# Patient Record
Sex: Female | Born: 1955
Health system: Southern US, Community
[De-identification: ages and names within clinical notes are randomized; demographics above are authoritative.]

## PROBLEM LIST (undated history)

## (undated) DIAGNOSIS — J309 Allergic rhinitis, unspecified: Secondary | ICD-10-CM

## (undated) DIAGNOSIS — N393 Stress incontinence (female) (male): Secondary | ICD-10-CM

## (undated) DIAGNOSIS — K219 Gastro-esophageal reflux disease without esophagitis: Secondary | ICD-10-CM

## (undated) DIAGNOSIS — D219 Benign neoplasm of connective and other soft tissue, unspecified: Secondary | ICD-10-CM

## (undated) DIAGNOSIS — R569 Unspecified convulsions: Secondary | ICD-10-CM

## (undated) DIAGNOSIS — G5600 Carpal tunnel syndrome, unspecified upper limb: Secondary | ICD-10-CM

## (undated) DIAGNOSIS — J45909 Unspecified asthma, uncomplicated: Secondary | ICD-10-CM

## (undated) HISTORY — PX: TUBAL LIGATION: SHX77

---

## 2004-05-06 ENCOUNTER — Other Ambulatory Visit: Payer: Self-pay

## 2004-05-06 ENCOUNTER — Emergency Department: Payer: Self-pay | Admitting: Emergency Medicine

## 2006-02-14 ENCOUNTER — Ambulatory Visit: Payer: Self-pay

## 2007-05-07 ENCOUNTER — Ambulatory Visit: Payer: Self-pay

## 2007-11-15 ENCOUNTER — Ambulatory Visit: Payer: Self-pay

## 2008-09-15 ENCOUNTER — Ambulatory Visit: Payer: Self-pay

## 2009-04-08 ENCOUNTER — Emergency Department: Payer: Self-pay | Admitting: Emergency Medicine

## 2010-04-27 ENCOUNTER — Ambulatory Visit: Payer: Self-pay | Admitting: Nurse Practitioner

## 2012-07-31 ENCOUNTER — Ambulatory Visit: Payer: Self-pay

## 2015-01-04 ENCOUNTER — Other Ambulatory Visit: Payer: Self-pay | Admitting: Obstetrics and Gynecology

## 2015-01-04 DIAGNOSIS — Z87440 Personal history of urinary (tract) infections: Secondary | ICD-10-CM

## 2015-01-05 ENCOUNTER — Other Ambulatory Visit: Payer: Self-pay | Admitting: Obstetrics and Gynecology

## 2015-01-05 DIAGNOSIS — Z1231 Encounter for screening mammogram for malignant neoplasm of breast: Secondary | ICD-10-CM

## 2015-01-08 ENCOUNTER — Ambulatory Visit
Admission: RE | Admit: 2015-01-08 | Discharge: 2015-01-08 | Disposition: A | Discharge status: Home / Self Care | Payer: PRIVATE HEALTH INSURANCE | Source: Ambulatory Visit | Attending: Obstetrics and Gynecology | Admitting: Obstetrics and Gynecology

## 2015-01-08 DIAGNOSIS — Z87440 Personal history of urinary (tract) infections: Secondary | ICD-10-CM | POA: Diagnosis present

## 2015-01-08 DIAGNOSIS — K760 Fatty (change of) liver, not elsewhere classified: Secondary | ICD-10-CM | POA: Diagnosis not present

## 2015-01-08 MED ORDER — IOHEXOL 300 MG/ML  SOLN
125.0000 mL | Freq: Once | INTRAMUSCULAR | Status: AC | PRN
  Administered 2015-01-08: 125 mL via INTRAVENOUS

## 2015-01-12 ENCOUNTER — Ambulatory Visit
Admission: RE | Admit: 2015-01-12 | Discharge: 2015-01-12 | Disposition: A | Discharge status: Home / Self Care | Payer: PRIVATE HEALTH INSURANCE | Source: Ambulatory Visit | Attending: Obstetrics and Gynecology | Admitting: Obstetrics and Gynecology

## 2015-01-12 DIAGNOSIS — Z1231 Encounter for screening mammogram for malignant neoplasm of breast: Secondary | ICD-10-CM | POA: Insufficient documentation

## 2015-02-09 ENCOUNTER — Ambulatory Visit: Payer: Self-pay | Admitting: Urology

## 2015-02-09 ENCOUNTER — Encounter: Payer: Self-pay | Admitting: Urology

## 2015-06-20 HISTORY — PX: OTHER SURGICAL HISTORY: SHX169

## 2015-12-03 ENCOUNTER — Other Ambulatory Visit: Payer: Self-pay | Admitting: Obstetrics and Gynecology

## 2015-12-03 DIAGNOSIS — Z1231 Encounter for screening mammogram for malignant neoplasm of breast: Secondary | ICD-10-CM

## 2016-01-10 ENCOUNTER — Ambulatory Visit
Admission: RE | Admit: 2016-01-10 | Discharge: 2016-01-10 | Disposition: A | Discharge status: Home / Self Care | Payer: BLUE CROSS/BLUE SHIELD | Source: Ambulatory Visit | Attending: Obstetrics and Gynecology | Admitting: Obstetrics and Gynecology

## 2016-01-10 DIAGNOSIS — Z1231 Encounter for screening mammogram for malignant neoplasm of breast: Secondary | ICD-10-CM | POA: Diagnosis not present

## 2016-02-10 ENCOUNTER — Encounter: Payer: Self-pay | Admitting: *Deleted

## 2016-02-11 ENCOUNTER — Ambulatory Visit
Admission: RE | Admit: 2016-02-11 | Discharge: 2016-02-11 | Disposition: A | Discharge status: Home / Self Care | Payer: BLUE CROSS/BLUE SHIELD | Source: Ambulatory Visit | Attending: Gastroenterology | Admitting: Gastroenterology

## 2016-02-11 ENCOUNTER — Ambulatory Visit: Payer: BLUE CROSS/BLUE SHIELD | Admitting: Anesthesiology

## 2016-02-11 ENCOUNTER — Encounter
Admission: RE | Disposition: A | Discharge status: Home / Self Care | Payer: Self-pay | Source: Ambulatory Visit | Attending: Gastroenterology

## 2016-02-11 ENCOUNTER — Encounter: Payer: Self-pay | Admitting: Anesthesiology

## 2016-02-11 DIAGNOSIS — G5600 Carpal tunnel syndrome, unspecified upper limb: Secondary | ICD-10-CM | POA: Diagnosis not present

## 2016-02-11 DIAGNOSIS — K5730 Diverticulosis of large intestine without perforation or abscess without bleeding: Secondary | ICD-10-CM | POA: Diagnosis not present

## 2016-02-11 DIAGNOSIS — N393 Stress incontinence (female) (male): Secondary | ICD-10-CM | POA: Insufficient documentation

## 2016-02-11 DIAGNOSIS — J45909 Unspecified asthma, uncomplicated: Secondary | ICD-10-CM | POA: Diagnosis not present

## 2016-02-11 DIAGNOSIS — K219 Gastro-esophageal reflux disease without esophagitis: Secondary | ICD-10-CM | POA: Diagnosis not present

## 2016-02-11 DIAGNOSIS — Z1211 Encounter for screening for malignant neoplasm of colon: Secondary | ICD-10-CM | POA: Diagnosis not present

## 2016-02-11 HISTORY — PX: COLONOSCOPY WITH PROPOFOL: SHX5780

## 2016-02-11 HISTORY — DX: Gastro-esophageal reflux disease without esophagitis: K21.9

## 2016-02-11 HISTORY — DX: Benign neoplasm of connective and other soft tissue, unspecified: D21.9

## 2016-02-11 HISTORY — DX: Unspecified asthma, uncomplicated: J45.909

## 2016-02-11 HISTORY — DX: Allergic rhinitis, unspecified: J30.9

## 2016-02-11 HISTORY — DX: Carpal tunnel syndrome, unspecified upper limb: G56.00

## 2016-02-11 HISTORY — DX: Unspecified convulsions: R56.9

## 2016-02-11 HISTORY — DX: Stress incontinence (female) (male): N39.3

## 2016-02-11 SURGERY — COLONOSCOPY WITH PROPOFOL
Anesthesia: General

## 2016-02-11 MED ORDER — FENTANYL CITRATE (PF) 100 MCG/2ML IJ SOLN
INTRAMUSCULAR | Status: DC | PRN
  Administered 2016-02-11: 50 ug via INTRAVENOUS

## 2016-02-11 MED ORDER — PROPOFOL 10 MG/ML IV BOLUS
INTRAVENOUS | Status: DC | PRN
  Administered 2016-02-11: 40 mg via INTRAVENOUS

## 2016-02-11 MED ORDER — PROPOFOL 500 MG/50ML IV EMUL
INTRAVENOUS | Status: DC | PRN
  Administered 2016-02-11: 140 ug/kg/min via INTRAVENOUS

## 2016-02-11 MED ORDER — SODIUM CHLORIDE 0.9 % IV SOLN
INTRAVENOUS | Status: DC
  Administered 2016-02-11: 1000 mL via INTRAVENOUS
  Administered 2016-02-11: 10:00:00 via INTRAVENOUS

## 2016-02-11 MED ORDER — SODIUM CHLORIDE 0.9 % IV SOLN: INTRAVENOUS | Status: DC

## 2016-02-11 MED ORDER — MIDAZOLAM HCL 2 MG/2ML IJ SOLN
INTRAMUSCULAR | Status: DC | PRN
  Administered 2016-02-11: 1 mg via INTRAVENOUS

## 2016-02-11 NOTE — Transfer of Care (Signed)
Immediate Anesthesia Transfer of Care Note  Patient: Jamie Fisher  Procedure(s) Performed: Procedure(s): COLONOSCOPY WITH PROPOFOL (N/A)  Patient Location: PACU  Anesthesia Type:General  Level of Consciousness: sedated  Airway & Oxygen Therapy: Patient Spontanous Breathing and Patient connected to nasal cannula oxygen  Post-op Assessment: Report given to RN and Post -op Vital signs reviewed and stable  Post vital signs: Reviewed and stable  Last Vitals:  Vitals:   02/11/16 0816  BP: (!) 154/93  Pulse: 72  Resp: 16  Temp: 37.1 C    Last Pain:  Vitals:   02/11/16 0816  TempSrc: Tympanic         Complications: No apparent anesthesia complications

## 2016-02-11 NOTE — Anesthesia Postprocedure Evaluation (Signed)
Anesthesia Post Note  Patient: Jamie Fisher  Procedure(s) Performed: Procedure(s) (LRB): COLONOSCOPY WITH PROPOFOL (N/A)  Patient location during evaluation: Endoscopy Anesthesia Type: General Level of consciousness: awake and alert Pain management: pain level controlled Vital Signs Assessment: post-procedure vital signs reviewed and stable Respiratory status: spontaneous breathing, nonlabored ventilation, respiratory function stable and patient connected to nasal cannula oxygen Cardiovascular status: blood pressure returned to baseline and stable Postop Assessment: no signs of nausea or vomiting Anesthetic complications: no    Last Vitals:  Vitals:   02/11/16 1034 02/11/16 1044  BP: 114/62 (!) 105/59  Pulse: (!) 58 (!) 56  Resp: 10 13  Temp:      Last Pain:  Vitals:   02/11/16 1004  TempSrc: Tympanic                 [name]

## 2016-02-11 NOTE — Op Note (Signed)
[facility] Gastroenterology Patient Name: Jamie Fisher Procedure Date: 02/11/2016 9:29 AM MRN: [id number] Account #: [id number] Date of Birth: 02-22-1956 Admit Type: Outpatient Age: 60 Room: [facility] ENDO ROOM 1 Gender: Female Note Status: Finalized Procedure:            Colonoscopy Indications:          Screening for colorectal malignant neoplasm Providers:            Lollie Sails, MD Referring MD:         Joylene Igo, MD Medicines:            Monitored Anesthesia Care Complications:        No immediate complications. Procedure:            Pre-Anesthesia Assessment:                       - ASA Grade Assessment: II - A patient with mild                        systemic disease.                       After obtaining informed consent, the colonoscope was                        passed under direct vision. Throughout the procedure,                        the patient's blood pressure, pulse, and oxygen                        saturations were monitored continuously. The                        Colonoscope was introduced through the anus and                        advanced to the the cecum, identified by the ileocecal                        valve. The colonoscopy was unusually difficult due to                        poor bowel prep with stool present. Successful                        completion of the procedure was aided by lavage. The                        patient tolerated the procedure well. The quality of                        the bowel preparation was good except the ascending                        colon and cecum was poor. Findings:      Multiple medium-mouthed diverticula were found in the sigmoid colon and       descending colon.      A large amount of solid stool was found in the ascending colon and in  the cecum, precluding visualization.      The retroflexed view of the distal rectum and anal verge was normal and       showed no anal or rectal  abnormalities.      The digital rectal exam was normal.      The digital rectal exam was normal. Impression:           - Diverticulosis in the sigmoid colon and in the                        descending colon.                       - Stool in the ascending colon and in the cecum.                       - The distal rectum and anal verge are normal on                        retroflexion view.                       - No specimens collected. Recommendation:       - Discharge patient to home.                       - Repeat colonoscopy in 2 years for screening purposes.                       - Return to GI office at appointment to be scheduled. Procedure Code(s):    --- Professional ---                       [phone], Colonoscopy, flexible; diagnostic, including                        collection of specimen(s) by brushing or washing, when                        performed (separate procedure) Diagnosis Code(s):    --- Professional ---                       Z12.11, Encounter for screening for malignant neoplasm                        of colon                       K57.30, Diverticulosis of large intestine without                        perforation or abscess without bleeding CPT copyright 2016 American [employer] Association. All rights reserved. The codes documented in this report are preliminary and upon coder review may  be revised to meet current compliance requirements. Lollie Sails, MD 02/11/2016 10:05:21 AM This report has been signed electronically. Number of Addenda: 0 Note Initiated On: 02/11/2016 9:29 AM Scope Withdrawal Time: 0 hours 3 minutes 7 seconds  Total Procedure Duration: 0 hours 19 minutes 27 seconds       [facility]

## 2016-02-11 NOTE — H&P (Signed)
Outpatient short stay form Pre-procedure 02/11/2016 9:14 AM Lollie Sails MD  Primary Physician: Joylene Igo M.D.  Reason for visit:  Patient is a 60 year old female presenting today for screening colonoscopy. She tolerated her prep well. She did eat some solid foods yesterday before she started prep. She has been checked this morning and rectal exam shows no debris and a bowel movement was watery clear. She takes no aspirin or blood thinning agents.    History of present illness:  Colonoscopy as above    Current Facility-Administered Medications:  .  0.9 %  sodium chloride infusion, , Intravenous, Continuous, Lollie Sails, MD, Last Rate: 20 mL/hr at 02/11/16 0838, 1,000 mL at 02/11/16 0838 .  0.9 %  sodium chloride infusion, , Intravenous, Continuous, Lollie Sails, MD  No prescriptions prior to admission.     No Known Allergies   Past Medical History:  Diagnosis Date  . Allergic rhinitis   . Asthma   . Carpal tunnel syndrome   . Fibroid   . GERD (gastroesophageal reflux disease)   . Seizures (HCC)    AS A CHILD  . Stress incontinence in female     Review of systems:      Physical Exam    Heart and lungs: Regular rate and rhythm without rub or gallop, lungs are bilaterally clear.    HEENT: Normocephalic atraumatic eyes are anicteric    Other:     Pertinant exam for procedure: Soft nontender nondistended bowel sounds positive normoactive.    Planned proceedures: Colonoscopy and indicated procedures. I have discussed the risks benefits and complications of procedures to include not limited to bleeding, infection, perforation and the risk of sedation and the patient wishes to proceed.   Lollie Sails, MD Gastroenterology 02/11/2016  9:14 AM

## 2016-02-11 NOTE — Anesthesia Preprocedure Evaluation (Signed)
Anesthesia Evaluation  Patient identified by MRN, date of birth, ID band Patient awake    Reviewed: Allergy & Precautions, H&P , NPO status , Patient's Chart, lab work & pertinent test results, reviewed documented beta blocker date and time   History of Anesthesia Complications Negative for: history of anesthetic complications  Airway Mallampati: II  TM Distance: >3 FB Neck ROM: full    Dental no notable dental hx. (+) Caps, Teeth Intact   Pulmonary neg shortness of breath, asthma , neg sleep apnea, neg COPD, neg recent URI,    Pulmonary exam normal breath sounds clear to auscultation       Cardiovascular Exercise Tolerance: Good negative cardio ROS Normal cardiovascular exam Rhythm:regular Rate:Normal     Neuro/Psych Seizures - (as a child),   Neuromuscular disease ([place]) negative psych ROS   GI/Hepatic Neg liver ROS, GERD  ,  Endo/Other  negative endocrine ROS  Renal/GU negative Renal ROS  negative genitourinary   Musculoskeletal   Abdominal   Peds  Hematology negative hematology ROS (+)   Anesthesia Other Findings Past Medical History: No date: Allergic rhinitis No date: Asthma No date: Carpal tunnel syndrome No date: Fibroid No date: GERD (gastroesophageal reflux disease) No date: Seizures (HCC)     Comment: AS A CHILD No date: Stress incontinence in female   Reproductive/Obstetrics negative OB ROS                             Anesthesia Physical Anesthesia Plan  ASA: II  Anesthesia Plan: General   Post-op Pain Management:    Induction:   Airway Management Planned:   Additional Equipment:   Intra-op Plan:   Post-operative Plan:   Informed Consent: I have reviewed the patients History and Physical, chart, labs and discussed the procedure including the risks, benefits and alternatives for the proposed anesthesia with the patient or authorized representative who has  indicated his/her understanding and acceptance.   Dental Advisory Given  Plan Discussed with: Anesthesiologist, CRNA and Surgeon  Anesthesia Plan Comments:         Anesthesia Quick Evaluation

## 2016-02-11 NOTE — Anesthesia Procedure Notes (Signed)
Date/Time: 02/11/2016 9:45 AM Performed by: Allean Found Pre-anesthesia Checklist: Patient identified, Emergency Drugs available, Suction available, Patient being monitored and Timeout performed Patient Re-evaluated:Patient Re-evaluated prior to inductionOxygen Delivery Method: Nasal cannula

## 2016-02-14 ENCOUNTER — Encounter: Payer: Self-pay | Admitting: Gastroenterology

## 2016-02-22 ENCOUNTER — Other Ambulatory Visit: Payer: Self-pay | Admitting: Gastroenterology

## 2016-02-22 DIAGNOSIS — R1010 Upper abdominal pain, unspecified: Secondary | ICD-10-CM

## 2016-02-25 ENCOUNTER — Ambulatory Visit
Admission: RE | Admit: 2016-02-25 | Discharge: 2016-02-25 | Disposition: A | Discharge status: Home / Self Care | Payer: BLUE CROSS/BLUE SHIELD | Source: Ambulatory Visit | Attending: Gastroenterology | Admitting: Gastroenterology

## 2016-02-25 DIAGNOSIS — R1010 Upper abdominal pain, unspecified: Secondary | ICD-10-CM | POA: Diagnosis present

## 2016-02-25 DIAGNOSIS — K449 Diaphragmatic hernia without obstruction or gangrene: Secondary | ICD-10-CM | POA: Diagnosis not present

## 2016-02-25 DIAGNOSIS — K219 Gastro-esophageal reflux disease without esophagitis: Secondary | ICD-10-CM | POA: Insufficient documentation

## 2016-03-17 ENCOUNTER — Other Ambulatory Visit: Payer: Self-pay | Admitting: Gastroenterology

## 2016-03-17 DIAGNOSIS — R1084 Generalized abdominal pain: Secondary | ICD-10-CM

## 2016-03-22 ENCOUNTER — Ambulatory Visit
Admission: RE | Admit: 2016-03-22 | Discharge: 2016-03-22 | Disposition: A | Discharge status: Home / Self Care | Payer: BLUE CROSS/BLUE SHIELD | Source: Ambulatory Visit | Attending: Gastroenterology | Admitting: Gastroenterology

## 2016-03-22 DIAGNOSIS — R935 Abnormal findings on diagnostic imaging of other abdominal regions, including retroperitoneum: Secondary | ICD-10-CM | POA: Insufficient documentation

## 2016-03-22 DIAGNOSIS — R1084 Generalized abdominal pain: Secondary | ICD-10-CM

## 2016-03-22 DIAGNOSIS — R1010 Upper abdominal pain, unspecified: Secondary | ICD-10-CM | POA: Diagnosis present

## 2016-05-04 ENCOUNTER — Other Ambulatory Visit: Payer: Self-pay | Admitting: Gastroenterology

## 2016-05-04 DIAGNOSIS — R1013 Epigastric pain: Secondary | ICD-10-CM

## 2016-05-22 ENCOUNTER — Ambulatory Visit: Admission: RE | Admit: 2016-05-22 | Payer: BLUE CROSS/BLUE SHIELD | Source: Ambulatory Visit

## 2016-05-23 ENCOUNTER — Encounter
Admission: RE | Admit: 2016-05-23 | Discharge: 2016-05-23 | Disposition: A | Discharge status: Home / Self Care | Payer: BLUE CROSS/BLUE SHIELD | Source: Ambulatory Visit | Attending: Gastroenterology | Admitting: Gastroenterology

## 2016-05-23 DIAGNOSIS — R1013 Epigastric pain: Secondary | ICD-10-CM | POA: Diagnosis present

## 2016-05-23 MED ORDER — TECHNETIUM TC 99M MEBROFENIN IV KIT
5.3400 | PACK | Freq: Once | INTRAVENOUS | Status: AC | PRN
  Administered 2016-05-23: 5.34 via INTRAVENOUS

## 2016-05-24 ENCOUNTER — Other Ambulatory Visit: Payer: PRIVATE HEALTH INSURANCE

## 2016-08-21 ENCOUNTER — Encounter: Payer: Self-pay | Admitting: *Deleted

## 2016-08-22 ENCOUNTER — Ambulatory Visit: Payer: BLUE CROSS/BLUE SHIELD | Admitting: Anesthesiology

## 2016-08-22 ENCOUNTER — Encounter
Admission: RE | Disposition: A | Discharge status: Home / Self Care | Payer: Self-pay | Source: Ambulatory Visit | Attending: Gastroenterology

## 2016-08-22 ENCOUNTER — Ambulatory Visit
Admission: RE | Admit: 2016-08-22 | Discharge: 2016-08-22 | Disposition: A | Discharge status: Home / Self Care | Payer: BLUE CROSS/BLUE SHIELD | Source: Ambulatory Visit | Attending: Gastroenterology | Admitting: Gastroenterology

## 2016-08-22 ENCOUNTER — Encounter: Payer: Self-pay | Admitting: *Deleted

## 2016-08-22 ENCOUNTER — Other Ambulatory Visit: Payer: Self-pay | Admitting: Gastroenterology

## 2016-08-22 DIAGNOSIS — Z79899 Other long term (current) drug therapy: Secondary | ICD-10-CM | POA: Insufficient documentation

## 2016-08-22 DIAGNOSIS — G5600 Carpal tunnel syndrome, unspecified upper limb: Secondary | ICD-10-CM | POA: Insufficient documentation

## 2016-08-22 DIAGNOSIS — K2950 Unspecified chronic gastritis without bleeding: Secondary | ICD-10-CM | POA: Diagnosis not present

## 2016-08-22 DIAGNOSIS — K210 Gastro-esophageal reflux disease with esophagitis: Secondary | ICD-10-CM | POA: Insufficient documentation

## 2016-08-22 DIAGNOSIS — K2210 Ulcer of esophagus without bleeding: Secondary | ICD-10-CM | POA: Insufficient documentation

## 2016-08-22 DIAGNOSIS — J45909 Unspecified asthma, uncomplicated: Secondary | ICD-10-CM | POA: Diagnosis not present

## 2016-08-22 DIAGNOSIS — R1031 Right lower quadrant pain: Secondary | ICD-10-CM

## 2016-08-22 DIAGNOSIS — N393 Stress incontinence (female) (male): Secondary | ICD-10-CM | POA: Insufficient documentation

## 2016-08-22 DIAGNOSIS — R1013 Epigastric pain: Secondary | ICD-10-CM | POA: Insufficient documentation

## 2016-08-22 DIAGNOSIS — R1011 Right upper quadrant pain: Secondary | ICD-10-CM

## 2016-08-22 DIAGNOSIS — J309 Allergic rhinitis, unspecified: Secondary | ICD-10-CM | POA: Diagnosis not present

## 2016-08-22 DIAGNOSIS — R011 Cardiac murmur, unspecified: Secondary | ICD-10-CM | POA: Insufficient documentation

## 2016-08-22 HISTORY — PX: ESOPHAGOGASTRODUODENOSCOPY: SHX5428

## 2016-08-22 SURGERY — EGD (ESOPHAGOGASTRODUODENOSCOPY)
Anesthesia: General

## 2016-08-22 MED ORDER — PROPOFOL 500 MG/50ML IV EMUL
INTRAVENOUS | Status: DC | PRN
Start: 2016-08-22 — End: 2016-08-22
  Administered 2016-08-22: 50 ug/kg/min via INTRAVENOUS

## 2016-08-22 MED ORDER — PROPOFOL 500 MG/50ML IV EMUL
INTRAVENOUS | Status: AC
  Filled 2016-08-22: qty 50

## 2016-08-22 MED ORDER — EPHEDRINE SULFATE 50 MG/ML IJ SOLN
INTRAMUSCULAR | Status: AC
  Filled 2016-08-22: qty 1

## 2016-08-22 MED ORDER — PHENYLEPHRINE HCL 10 MG/ML IJ SOLN
INTRAMUSCULAR | Status: DC | PRN
  Administered 2016-08-22: 100 ug via INTRAVENOUS

## 2016-08-22 MED ORDER — SODIUM CHLORIDE 0.9 % IV SOLN
INTRAVENOUS | Status: DC
  Administered 2016-08-22: 10:00:00 via INTRAVENOUS

## 2016-08-22 MED ORDER — GLYCOPYRROLATE 0.2 MG/ML IJ SOLN
INTRAMUSCULAR | Status: AC
  Filled 2016-08-22: qty 1

## 2016-08-22 MED ORDER — KETAMINE HCL 10 MG/ML IJ SOLN
INTRAMUSCULAR | Status: AC
  Filled 2016-08-22: qty 1

## 2016-08-22 MED ORDER — KETAMINE HCL 10 MG/ML IJ SOLN
INTRAMUSCULAR | Status: DC | PRN
  Administered 2016-08-22 (×2): 10 mg via INTRAVENOUS

## 2016-08-22 NOTE — Transfer of Care (Signed)
Immediate Anesthesia Transfer of Care Note  Patient: Jamie Fisher  Procedure(s) Performed: Procedure(s): ESOPHAGOGASTRODUODENOSCOPY (EGD) (N/A)  Patient Location: PACU  Anesthesia Type:General  Level of Consciousness: awake, alert  and oriented  Airway & Oxygen Therapy: Patient Spontanous Breathing and Patient connected to nasal cannula oxygen  Post-op Assessment: Report given to RN and Post -op Vital signs reviewed and stable  Post vital signs: Reviewed and stable  Last Vitals:  Vitals:   08/22/16 0932  BP: 137/80  Pulse: (!) 56  Resp: 18  Temp: (!) 35.4 C    Last Pain:  Vitals:   08/22/16 0932  TempSrc: Tympanic         Complications: No apparent anesthesia complications

## 2016-08-22 NOTE — Transfer of Care (Signed)
Immediate Anesthesia Transfer of Care Note  Patient: Jamie Fisher  Procedure(s) Performed: Procedure(s): ESOPHAGOGASTRODUODENOSCOPY (EGD) (N/A)  Patient Location: Endoscopy Unit  Anesthesia Type:General  Level of Consciousness: sedated  Airway & Oxygen Therapy: Patient Spontanous Breathing  Post-op Assessment: Report given to RN and Post -op Vital signs reviewed and stable  Post vital signs: Reviewed and stable  Last Vitals:  Vitals:   08/22/16 0932  BP: 137/80  Pulse: (!) 56  Resp: 18  Temp: (!) 35.4 C    Last Pain:  Vitals:   08/22/16 0932  TempSrc: Tympanic         Complications: No apparent anesthesia complications

## 2016-08-22 NOTE — Op Note (Signed)
[facility] Gastroenterology Patient Name: Jamie Fisher Procedure Date: 08/22/2016 10:06 AM MRN: [id number] Account #: [id number] Date of Birth: 1956/02/03 Admit Type: Outpatient Age: 61 Room: [facility] ENDO ROOM 1 Gender: Female Note Status: Finalized Procedure:            Upper GI endoscopy Indications:          Epigastric abdominal pain, Dyspepsia Providers:            Lollie Sails, MD Referring MD:         No Local Md, MD (Referring MD) Medicines:            Monitored Anesthesia Care Complications:        No immediate complications. Procedure:            Pre-Anesthesia Assessment:                       - ASA Grade Assessment: II - A patient with mild                        systemic disease.                       After obtaining informed consent, the endoscope was                        passed under direct vision. Throughout the procedure,                        the patient's blood pressure, pulse, and oxygen                        saturations were monitored continuously. The Endoscope                        was introduced through the mouth, and advanced to the                        third part of duodenum. The upper GI endoscopy was                        accomplished without difficulty. The patient tolerated                        the procedure well. Findings:      LA Grade B (one or more mucosal breaks greater than 5 mm, not extending       between the tops of two mucosal folds) esophagitis with no bleeding was       found. Biopsies were taken with a cold forceps for histology.      The exam of the esophagus was otherwise normal.      Diffuse mild inflammation characterized by erythema was found in the       gastric body. Biopsies were taken with a cold forceps for histology.       Biopsies were taken with a cold forceps for Helicobacter pylori testing.      Patchy minimal inflammation characterized by erosions and erythema was       found in the gastric  antrum. Biopsies were taken with a cold forceps for       histology. Biopsies were taken with a cold forceps for Helicobacter  pylori testing.      The examined duodenum was normal. Impression:           - LA Grade B erosive esophagitis. Biopsied.                       - Gastritis. Biopsied.                       - Gastritis. Biopsied.                       - Normal examined duodenum. Recommendation:       - Await pathology results.                       - Use Protonix (pantoprazole) 40 mg PO once daily, take                        30 minutes before a meal.                       - Return to GI clinic in 1 month. Procedure Code(s):    --- Professional ---                       [id number], Esophagogastroduodenoscopy, flexible, transoral;                        with biopsy, single or multiple Diagnosis Code(s):    --- Professional ---                       K20.8, Other esophagitis                       K29.70, Gastritis, unspecified, without bleeding                       R10.13, Epigastric pain CPT copyright 2016 American Medical Association. All rights reserved. The codes documented in this report are preliminary and upon coder review may  be revised to meet current compliance requirements. Lollie Sails, MD 08/22/2016 10:30:30 AM This report has been signed electronically. Number of Addenda: 0 Note Initiated On: 08/22/2016 10:06 AM      [facility]

## 2016-08-22 NOTE — Anesthesia Postprocedure Evaluation (Signed)
Anesthesia Post Note  Patient: Jamie Fisher  Procedure(s) Performed: Procedure(s) (LRB): ESOPHAGOGASTRODUODENOSCOPY (EGD) (N/A)  Patient location during evaluation: Endoscopy Anesthesia Type: General Level of consciousness: awake and alert and oriented Pain management: pain level controlled Vital Signs Assessment: post-procedure vital signs reviewed and stable Respiratory status: spontaneous breathing, nonlabored ventilation and respiratory function stable Cardiovascular status: blood pressure returned to baseline and stable Postop Assessment: no signs of nausea or vomiting Anesthetic complications: no     Last Vitals:  Vitals:   08/22/16 0932  BP: 137/80  Pulse: (!) 56  Resp: 18  Temp: (!) 35.4 C    Last Pain:  Vitals:   08/22/16 0932  TempSrc: Tympanic                 [name]

## 2016-08-22 NOTE — Anesthesia Post-op Follow-up Note (Cosign Needed)
Anesthesia QCDR form completed.        

## 2016-08-22 NOTE — H&P (Signed)
Outpatient short stay form Pre-procedure 08/22/2016 9:56 AM Jamie Sails MD  Primary Physician: [facility]  Reason for visit:  EGD  History of present illness:  Jamie Fisher is a 61 year old female presenting today as above. She has a history of epigastric pain and reflux symptoms. He has been on a proton pump inhibitor intermittently. She did have a abdominal ultrasound and HIDA scan which showed a normal hepatobiliary study and gallbladder ejection fraction there was some crease hepatic echotexture consistent with fatty liver there were no gallstones noted. Common bile duct was normal at 4 mm.    Current Facility-Administered Medications:  .  0.9 %  sodium chloride infusion, , Intravenous, Continuous, Jamie Sails, MD .  0.9 %  sodium chloride infusion, , Intravenous, Continuous, Jamie Sails, MD  Prescriptions Prior to Admission  Medication Sig Dispense Refill Last Dose  . etodolac (LODINE) 500 MG tablet Take 500 mg by mouth 2 (two) times daily.     . pantoprazole (PROTONIX) 40 MG tablet Take 40 mg by mouth daily.        No Known Allergies   Past Medical History:  Diagnosis Date  . Allergic rhinitis   . Asthma   . Carpal tunnel syndrome   . Fibroid   . GERD (gastroesophageal reflux disease)   . Seizures (HCC)    AS A CHILD  . Stress incontinence in female     Review of systems:      Physical Exam    Heart and lungs: Regular rate and rhythm without rub or gallop, lungs are bilaterally clear    HEENT: Normocephalic atraumatic eyes are anicteric    Other:     Pertinant exam for procedure: Soft mild discomfort palpation the epigastric region. Bowel sounds positive normoactive.    Planned proceedures: EGD and indicated procedures. I have discussed the risks benefits and complications of procedures to include not limited to bleeding, infection, perforation and the risk of sedation and the patient wishes to proceed.    Jamie Sails,  MD Gastroenterology 08/22/2016  9:56 AM

## 2016-08-22 NOTE — Anesthesia Preprocedure Evaluation (Signed)
Anesthesia Evaluation  Patient identified by MRN, date of birth, ID band Patient awake    Reviewed: Allergy & Precautions, NPO status , Patient's Chart, lab work & pertinent test results  History of Anesthesia Complications Negative for: history of anesthetic complications  Airway Mallampati: II  TM Distance: >3 FB Neck ROM: Full    Dental  (+) Partial Lower   Pulmonary asthma (does not use any inhalers) ,    breath sounds clear to auscultation- rhonchi (-) wheezing      Cardiovascular Exercise Tolerance: Good (-) hypertension(-) CAD and (-) Past MI  Rhythm:Regular Rate:Normal - Systolic murmurs and - Diastolic murmurs    Neuro/Psych Seizures - (as a child, based on description unclear if they were actually seizures),     GI/Hepatic Neg liver ROS, GERD  ,  Endo/Other  negative endocrine ROSneg diabetes  Renal/GU negative Renal ROS     Musculoskeletal negative musculoskeletal ROS (+)   Abdominal (+) + obese,   Peds  Hematology negative hematology ROS (+)   Anesthesia Other Findings Past Medical History: No date: Allergic rhinitis No date: Asthma No date: Carpal tunnel syndrome No date: Fibroid No date: GERD (gastroesophageal reflux disease) No date: Seizures (HCC)     Comment: AS A CHILD No date: Stress incontinence in female   Reproductive/Obstetrics                             Anesthesia Physical Anesthesia Plan  ASA: II  Anesthesia Plan: General   Post-op Pain Management:    Induction: Intravenous  Airway Management Planned: Natural Airway  Additional Equipment:   Intra-op Plan:   Post-operative Plan:   Informed Consent: I have reviewed the patients History and Physical, chart, labs and discussed the procedure including the risks, benefits and alternatives for the proposed anesthesia with the patient or authorized representative who has indicated his/her understanding  and acceptance.   Dental advisory given  Plan Discussed with: CRNA and Anesthesiologist  Anesthesia Plan Comments:         Anesthesia Quick Evaluation

## 2016-08-23 ENCOUNTER — Encounter: Payer: Self-pay | Admitting: Gastroenterology

## 2016-08-23 LAB — SURGICAL PATHOLOGY

## 2016-08-29 ENCOUNTER — Ambulatory Visit: Admission: RE | Admit: 2016-08-29 | Payer: BLUE CROSS/BLUE SHIELD | Source: Ambulatory Visit

## 2016-08-31 ENCOUNTER — Ambulatory Visit
Admission: RE | Admit: 2016-08-31 | Discharge: 2016-08-31 | Disposition: A | Discharge status: Home / Self Care | Payer: BLUE CROSS/BLUE SHIELD | Source: Ambulatory Visit | Attending: Gastroenterology | Admitting: Gastroenterology

## 2016-08-31 DIAGNOSIS — D259 Leiomyoma of uterus, unspecified: Secondary | ICD-10-CM | POA: Insufficient documentation

## 2016-08-31 DIAGNOSIS — R1031 Right lower quadrant pain: Secondary | ICD-10-CM | POA: Diagnosis present

## 2016-08-31 DIAGNOSIS — K760 Fatty (change of) liver, not elsewhere classified: Secondary | ICD-10-CM | POA: Insufficient documentation

## 2016-08-31 DIAGNOSIS — R1011 Right upper quadrant pain: Secondary | ICD-10-CM | POA: Diagnosis not present

## 2016-08-31 LAB — POCT I-STAT CREATININE: CREATININE: 0.7 mg/dL (ref 0.44–1.00)

## 2016-08-31 MED ORDER — IOPAMIDOL (ISOVUE-300) INJECTION 61%
100.0000 mL | Freq: Once | INTRAVENOUS | Status: AC | PRN
  Administered 2016-08-31: 100 mL via INTRAVENOUS

## 2017-01-10 ENCOUNTER — Encounter: Payer: Self-pay | Admitting: Emergency Medicine

## 2017-01-10 ENCOUNTER — Emergency Department: Payer: BLUE CROSS/BLUE SHIELD

## 2017-01-10 ENCOUNTER — Emergency Department
Admission: EM | Admit: 2017-01-10 | Discharge: 2017-01-10 | Disposition: A | Discharge status: Home / Self Care | Payer: BLUE CROSS/BLUE SHIELD | Attending: Emergency Medicine | Admitting: Emergency Medicine

## 2017-01-10 DIAGNOSIS — R0600 Dyspnea, unspecified: Secondary | ICD-10-CM | POA: Diagnosis not present

## 2017-01-10 DIAGNOSIS — J45909 Unspecified asthma, uncomplicated: Secondary | ICD-10-CM | POA: Diagnosis not present

## 2017-01-10 DIAGNOSIS — Z79899 Other long term (current) drug therapy: Secondary | ICD-10-CM | POA: Insufficient documentation

## 2017-01-10 DIAGNOSIS — K219 Gastro-esophageal reflux disease without esophagitis: Secondary | ICD-10-CM | POA: Insufficient documentation

## 2017-01-10 DIAGNOSIS — R0789 Other chest pain: Secondary | ICD-10-CM | POA: Insufficient documentation

## 2017-01-10 MED ORDER — OXYCODONE-ACETAMINOPHEN 5-325 MG PO TABS
1.0000 | ORAL_TABLET | Freq: Once | ORAL | Status: AC
  Administered 2017-01-10: 1 via ORAL
  Filled 2017-01-10: qty 1

## 2017-01-10 MED ORDER — OXYCODONE-ACETAMINOPHEN 5-325 MG PO TABS: 1.0000 | ORAL_TABLET | Freq: Four times a day (QID) | ORAL | 0 refills | Status: AC | PRN

## 2017-01-10 NOTE — ED Triage Notes (Addendum)
Pt to triage via w/c with no distress noted; pt reports mid CP since yesterday radiating into back accomp by [facility]; denies hx of same; pt st fell week ago on her chest and believes her pain is related; seen at [facility] Monday and rx pain med and told to return if worse but no imaging or tests were performed

## 2017-01-10 NOTE — ED Provider Notes (Addendum)
[facility] Emergency Department Provider Note  ____________________________________________   I have reviewed the triage vital signs and the nursing notes.   HISTORY  Chief Complaint Chest Pain    HPI Jamie Fisher is a 61 y.o. female who has a history of reflux disease, but no history of ACS PE or dissection in herself or her family she states presents today complaining of chest wall pain. Patient was jumping up and down on the trampoline last week and she fell landing on her chest wall. She states it hurts when she touches it or changes position. She states it hurts when she takes a deep breath, hurts when she moves. It is very reproducible chest wall pain. She is worried that she may have broken a rib. She did go to the clinic, and received some etodolac however that is not sufficient to keep her pain under control. She denies fever or cough or exertional symptoms.Pain is sharp, nonradiating, it is minimally helps by etodolac, the pain is been present since the incident she fell on that exact part of her chest on the trampoline bumping against the edge. her primary care did see her for this and thought it was musculoskeletal. History is with interpreter and my Spanish.    Past Medical History:  Diagnosis Date  . Allergic rhinitis   . Asthma   . Carpal tunnel syndrome   . Fibroid   . GERD (gastroesophageal reflux disease)   . Seizures (HCC)    AS A CHILD  . Stress incontinence in female     There are no active problems to display for this patient.   Past Surgical History:  Procedure Laterality Date  . COLONOSCOPY WITH PROPOFOL N/A 02/11/2016   Procedure: COLONOSCOPY WITH PROPOFOL;  Surgeon: Lollie Sails, MD;  Location: [facility] ENDOSCOPY;  Service: Endoscopy;  Laterality: N/A;  . ESOPHAGOGASTRODUODENOSCOPY N/A 08/22/2016   Procedure: ESOPHAGOGASTRODUODENOSCOPY (EGD);  Surgeon: Lollie Sails, MD;  Location: [facility] ENDOSCOPY;  Service: Endoscopy;   Laterality: N/A;  . REMOVE NODULE Right 2017   BUTTOCK  . TUBAL LIGATION      Prior to Admission medications   Medication Sig Start Date End Date Taking? Authorizing Provider  tiZANidine (ZANAFLEX) 2 MG tablet 1-2 tablets at bedtime as needed for muscle spasm 01/03/17  Yes [provider]  etodolac (LODINE) 500 MG tablet Take 500 mg by mouth 2 (two) times daily.    [provider]  pantoprazole (PROTONIX) 40 MG tablet Take 40 mg by mouth daily.    [provider]    Allergies Patient has no known allergies.  No family history on file.  Social History Social History  Substance Use Topics  . Smoking status: Never Smoker  . Smokeless tobacco: Never Used  . Alcohol use No    Review of Systems Constitutional: No fever/chills Eyes: No visual changes. ENT: No sore throat. No stiff neck no neck pain Cardiovascular: Denies chest pain. Respiratory: Denies cough or any significant shortness of breath, hurts to breathe because of her chest wall Gastrointestinal:   no vomiting.  No diarrhea.  No constipation. Genitourinary: Negative for dysuria. Musculoskeletal: Negative lower extremity swelling Skin: Negative for rash. Neurological: Negative for severe headaches, focal weakness or numbness.   ____________________________________________   PHYSICAL EXAM:  VITAL SIGNS: ED Triage Vitals  Enc Vitals Group     BP 01/10/17 0636 119/71     Pulse Rate 01/10/17 0636 84     Resp 01/10/17 0636 20  Temp 01/10/17 0636 98.5 F (36.9 C)     Temp Source 01/10/17 0636 Oral     SpO2 01/10/17 0636 97 %     Weight 01/10/17 0637 157 lb (71.2 kg)     Height 01/10/17 0637 5' (1.524 m)     Head Circumference --      Peak Flow --      Pain Score 01/10/17 0635 10     Pain Loc --      Pain Edu? --      Excl. in [place]? --     Constitutional: Alert and oriented. Well appearing and in no acute distress.Somewhat anxious Eyes: Conjunctivae are normal Head:  Atraumatic HEENT: No congestion/rhinnorhea. Mucous membranes are moist.  Oropharynx non-erythematous Neck:   Nontender with no meningismus, no masses, no stridor Cardiovascular: Normal rate, regular rhythm. Grossly normal heart sounds.  Good peripheral circulation. Respiratory: Normal respiratory effort.  No retractions. Lungs CTAB. Chest, touch the patient's chest wall just lateral to the sternum on the left, she states "ouch that's the pain right there" and pulls back. There is no obvious bruising or fracture. There is no crepitus there is no flail chest. However, this very strongly reproduces her pain and she cannot tolerate palpation of this area. No bruising or lesions noted. Abdominal: Soft and nontender. No distention. No guarding no rebound Back:  There is no focal tenderness or step off.  there is no midline tenderness there are no lesions noted. there is no CVA tenderness Musculoskeletal: No lower extremity tenderness, no upper extremity tenderness. No joint effusions, no DVT signs strong distal pulses no edema Neurologic:  Normal speech and language. No gross focal neurologic deficits are appreciated.  Skin:  Skin is warm, dry and intact. No rash noted. Psychiatric: Mood and affect are normal. Speech and behavior are normal.  ____________________________________________   LABS (all labs ordered are listed, but only abnormal results are displayed)  Labs Reviewed - No data to display ____________________________________________  EKG  I personally interpreted any EKGs ordered by me or triage Normal sinus rhythm rate 70 bpm no acute ST elevation or acute ST depression normal axis ____________________________________________  RADIOLOGY  I reviewed any imaging ordered by me or triage that were performed during my shift and, if possible, patient and/or family made aware of any abnormal findings. ____________________________________________   PROCEDURES  Procedure(s) performed:  None  Procedures  Critical Care performed: None  ____________________________________________   INITIAL IMPRESSION / ASSESSMENT AND PLAN / ED COURSE  Pertinent labs & imaging results that were available during my care of the patient were reviewed by me and considered in my medical decision making (see chart for details).  At this time, there does not appear to be clinical evidence to support the diagnosis of pulmonary embolus, dissection, myocarditis, endocarditis, pericarditis, pericardial tamponade, acute coronary syndrome, pneumothorax, pneumonia, or any other acute intrathoracic pathology that will require admission or acute intervention. Nor is there evidence of any significant intra-abdominal pathology causing this discomfort.  Patient has very reproducible chest wall pain after a fall. It has been there uncontrollably. Which she fell. It is reproducible it hurts when she moves, hurts when she touches it etc. There are no red flags this time to suggest that this represents anything more significant. I will give her incentive spirometry we'll increase her pain medication as I don't wish her to develop a pneumonia we'll have her closely follow up with primary care doctor. Return precautions and follow-up given and understood also she  understands not to drink or drive etc on percocet.  ----------------------------------------- 8:58 AM on 01/10/2017 -----------------------------------------  Immediately prior to discharge, patient recollected that when she fell and tripped when she felt a "crack" in her neck and she is worried that maybe she broke her neck. She failed to mention this until discharge, and her neck was nontender to my exam initially. At this time I have very low suspicion of fracture however given that she is complaining of neck pain and had a trauma and states that she felt a pop or something in her neck we will obtain imaging. Low suspicion for acute fracture. She can range  her neck but she states it's uncomfortable. Patient also has pain when she sits up in the bed and pulls up using her hands because her chest wall still tender however she states that after taking Percocet she is feeling much better than before. The fact that her chest is feeling better now, she states, allows her to focus on the neck pain that she forgot to mention before.  ----------------------------------------- 9:01 AM on 01/10/2017 -----------------------------------------  Patient declines CT scan she understands that I can't rule out fracture or other pathology without imaging although I have Low suspicion without it. She states she feels that she just pulled a muscle and she would like to go home. Return precautions and follow-up given and understood and I really can't reproduce her discomfort in any event from this fall 8 days ago. Accordingly therefore, patient request, I have canceled the CT scan.    ____________________________________________   FINAL CLINICAL IMPRESSION(S) / ED DIAGNOSES  Final diagnoses:  None      This chart was dictated using voice recognition software.  Despite best efforts to proofread,  errors can occur which can change meaning.      Schuyler Amor, MD 01/10/17 1155    Schuyler Amor, MD 01/10/17 2080    Schuyler Amor, MD 01/10/17 0745    Schuyler Amor, MD 01/10/17 0900    Schuyler Amor, MD 01/10/17 [phone]

## 2017-01-10 NOTE — ED Notes (Signed)
ED Provider at bedside. 

## 2017-01-10 NOTE — ED Notes (Signed)
Interpreter at bedside for D/C instructions.

## 2018-01-10 ENCOUNTER — Other Ambulatory Visit: Payer: Self-pay | Admitting: Family Medicine

## 2018-01-10 DIAGNOSIS — Z1231 Encounter for screening mammogram for malignant neoplasm of breast: Secondary | ICD-10-CM

## 2018-01-12 ENCOUNTER — Emergency Department
Admission: EM | Admit: 2018-01-12 | Discharge: 2018-01-12 | Disposition: A | Discharge status: Home / Self Care | Payer: BLUE CROSS/BLUE SHIELD | Attending: Emergency Medicine | Admitting: Emergency Medicine

## 2018-01-12 ENCOUNTER — Emergency Department: Payer: BLUE CROSS/BLUE SHIELD

## 2018-01-12 DIAGNOSIS — J45909 Unspecified asthma, uncomplicated: Secondary | ICD-10-CM | POA: Insufficient documentation

## 2018-01-12 DIAGNOSIS — R079 Chest pain, unspecified: Secondary | ICD-10-CM

## 2018-01-12 DIAGNOSIS — I252 Old myocardial infarction: Secondary | ICD-10-CM | POA: Insufficient documentation

## 2018-01-12 DIAGNOSIS — F410 Panic disorder [episodic paroxysmal anxiety] without agoraphobia: Secondary | ICD-10-CM | POA: Diagnosis not present

## 2018-01-12 DIAGNOSIS — Z79899 Other long term (current) drug therapy: Secondary | ICD-10-CM | POA: Diagnosis not present

## 2018-01-12 DIAGNOSIS — R0602 Shortness of breath: Secondary | ICD-10-CM | POA: Diagnosis not present

## 2018-01-12 LAB — CBC WITH DIFFERENTIAL/PLATELET
BASOS PCT: 1 %
Basophils Absolute: 0.1 10*3/uL (ref 0–0.1)
EOS ABS: 0.3 10*3/uL (ref 0–0.7)
Eosinophils Relative: 3 %
HEMATOCRIT: 39.8 % (ref 35.0–47.0)
Hemoglobin: 13.7 g/dL (ref 12.0–16.0)
Lymphocytes Relative: 29 %
Lymphs Abs: 3.4 10*3/uL (ref 1.0–3.6)
MCH: 29.8 pg (ref 26.0–34.0)
MCHC: 34.3 g/dL (ref 32.0–36.0)
MCV: 86.9 fL (ref 80.0–100.0)
MONO ABS: 0.7 10*3/uL (ref 0.2–0.9)
Monocytes Relative: 6 %
NEUTROS ABS: 7.2 10*3/uL — AB (ref 1.4–6.5)
Neutrophils Relative %: 61 %
PLATELETS: 369 10*3/uL (ref 150–440)
RBC: 4.58 MIL/uL (ref 3.80–5.20)
RDW: 13.3 % (ref 11.5–14.5)
WBC: 11.7 10*3/uL — ABNORMAL HIGH (ref 3.6–11.0)

## 2018-01-12 LAB — BASIC METABOLIC PANEL
ANION GAP: 13 (ref 5–15)
BUN: 21 mg/dL (ref 8–23)
CO2: 19 mmol/L — ABNORMAL LOW (ref 22–32)
Calcium: 9.8 mg/dL (ref 8.9–10.3)
Chloride: 107 mmol/L (ref 98–111)
Creatinine, Ser: 0.61 mg/dL (ref 0.44–1.00)
GFR calc Af Amer: >60 mL/min (ref >60)
Glucose, Bld: 193 mg/dL — ABNORMAL HIGH (ref 70–99)
POTASSIUM: 3.5 mmol/L (ref 3.5–5.1)
SODIUM: 139 mmol/L (ref 135–145)

## 2018-01-12 LAB — TROPONIN I

## 2018-01-12 MED ORDER — GI COCKTAIL ~~LOC~~
30.0000 mL | ORAL | Status: AC
  Administered 2018-01-12: 30 mL via ORAL
  Filled 2018-01-12: qty 30

## 2018-01-12 MED ORDER — ASPIRIN 81 MG PO CHEW
324.0000 mg | CHEWABLE_TABLET | Freq: Once | ORAL | Status: AC
  Administered 2018-01-12: 324 mg via ORAL
  Filled 2018-01-12: qty 4

## 2018-01-12 MED ORDER — NITROGLYCERIN 0.4 MG SL SUBL: 0.4000 mg | SUBLINGUAL_TABLET | SUBLINGUAL | Status: DC | PRN

## 2018-01-12 MED ORDER — SODIUM CHLORIDE 0.9 % IV BOLUS
1000.0000 mL | Freq: Once | INTRAVENOUS | Status: AC
  Administered 2018-01-12: 1000 mL via INTRAVENOUS

## 2018-01-12 MED ORDER — FAMOTIDINE 20 MG PO TABS
40.0000 mg | ORAL_TABLET | Freq: Once | ORAL | Status: AC
Start: 2018-01-12 — End: 2018-01-12
  Administered 2018-01-12: 40 mg via ORAL
  Filled 2018-01-12: qty 2
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
## 2018-01-12 NOTE — Discharge Instructions (Signed)
Your tests today were all okay.  Follow up with your primary care doctor in 2 days for continued monitoring of your symptoms.
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
## 2018-01-12 NOTE — ED Triage Notes (Signed)
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 Per coworker she started coughing and felt short of breath and weak. Pt was hyperventilating on arrival. Denies pain. Per coworker pt was pouring different cleaning items on the floor when it happened.

## 2018-01-12 NOTE — ED Triage Notes (Signed)
nonrebreather placed on pt for the hyperventilating. Respirations went from 55 down to 22 and pt now talking normally to the translator

## 2018-12-13 ENCOUNTER — Encounter: Payer: Self-pay | Admitting: Emergency Medicine

## 2018-12-13 ENCOUNTER — Other Ambulatory Visit: Payer: Self-pay

## 2018-12-13 ENCOUNTER — Emergency Department: Payer: BLUE CROSS/BLUE SHIELD

## 2018-12-13 ENCOUNTER — Emergency Department
Admission: EM | Admit: 2018-12-13 | Discharge: 2018-12-14 | Disposition: A | Discharge status: Home / Self Care | Payer: BLUE CROSS/BLUE SHIELD | Attending: Emergency Medicine | Admitting: Emergency Medicine

## 2018-12-13 DIAGNOSIS — R1031 Right lower quadrant pain: Secondary | ICD-10-CM | POA: Diagnosis present

## 2018-12-13 DIAGNOSIS — J45909 Unspecified asthma, uncomplicated: Secondary | ICD-10-CM | POA: Insufficient documentation

## 2018-12-13 DIAGNOSIS — K5792 Diverticulitis of intestine, part unspecified, without perforation or abscess without bleeding: Secondary | ICD-10-CM | POA: Diagnosis not present

## 2018-12-13 DIAGNOSIS — R1030 Lower abdominal pain, unspecified: Secondary | ICD-10-CM

## 2018-12-13 LAB — COMPREHENSIVE METABOLIC PANEL
ALT: 69 U/L — ABNORMAL HIGH (ref 0–44)
AST: 64 U/L — ABNORMAL HIGH (ref 15–41)
Albumin: 4.3 g/dL (ref 3.5–5.0)
Alkaline Phosphatase: 147 U/L — ABNORMAL HIGH (ref 38–126)
Anion gap: 10 (ref 5–15)
BUN: 11 mg/dL (ref 8–23)
CO2: 27 mmol/L (ref 22–32)
Calcium: 9.4 mg/dL (ref 8.9–10.3)
Chloride: 102 mmol/L (ref 98–111)
Creatinine, Ser: 0.54 mg/dL (ref 0.44–1.00)
GFR calc Af Amer: >60 mL/min (ref >60)
GFR calc non Af Amer: >60 mL/min (ref >60)
Glucose, Bld: 97 mg/dL (ref 70–99)
Potassium: 3.7 mmol/L (ref 3.5–5.1)
Sodium: 139 mmol/L (ref 135–145)
Total Bilirubin: 0.6 mg/dL (ref 0.3–1.2)
Total Protein: 8.2 g/dL — ABNORMAL HIGH (ref 6.5–8.1)

## 2018-12-13 LAB — CBC
HCT: 41.6 % (ref 36.0–46.0)
Hemoglobin: 14.1 g/dL (ref 12.0–15.0)
MCH: 29.4 pg (ref 26.0–34.0)
MCHC: 33.9 g/dL (ref 30.0–36.0)
MCV: 86.7 fL (ref 80.0–100.0)
Platelets: 355 10*3/uL (ref 150–400)
RBC: 4.8 MIL/uL (ref 3.87–5.11)
RDW: 13.3 % (ref 11.5–15.5)
WBC: 12.9 10*3/uL — ABNORMAL HIGH (ref 4.0–10.5)
nRBC: 0 % (ref 0.0–0.2)

## 2018-12-13 LAB — LIPASE, BLOOD: Lipase: 42 U/L (ref 11–51)

## 2018-12-13 MED ORDER — MORPHINE SULFATE (PF) 4 MG/ML IV SOLN
4.0000 mg | Freq: Once | INTRAVENOUS | Status: AC
  Administered 2018-12-13: 4 mg via INTRAVENOUS
  Filled 2018-12-13: qty 1

## 2018-12-13 MED ORDER — ONDANSETRON HCL 4 MG/2ML IJ SOLN
4.0000 mg | Freq: Once | INTRAMUSCULAR | Status: AC
  Administered 2018-12-13: 22:00:00 4 mg via INTRAVENOUS
  Filled 2018-12-13: qty 2

## 2018-12-13 MED ORDER — DOCUSATE SODIUM 100 MG PO CAPS: 100.0000 mg | ORAL_CAPSULE | Freq: Every day | ORAL | 2 refills | Status: AC | PRN

## 2018-12-13 MED ORDER — SODIUM CHLORIDE 0.9% FLUSH: 3.0000 mL | Freq: Once | INTRAVENOUS | Status: DC

## 2018-12-13 MED ORDER — OXYCODONE-ACETAMINOPHEN 5-325 MG PO TABS
1.0000 | ORAL_TABLET | Freq: Three times a day (TID) | ORAL | 0 refills | Status: AC | PRN
Start: 2018-12-13 — End: ?

## 2018-12-13 MED ORDER — ONDANSETRON 4 MG PO TBDP: 4.0000 mg | ORAL_TABLET | Freq: Three times a day (TID) | ORAL | 0 refills | Status: AC | PRN

## 2018-12-13 MED ORDER — PIPERACILLIN-TAZOBACTAM 3.375 G IVPB 30 MIN
3.3750 g | Freq: Once | INTRAVENOUS | Status: AC
  Administered 2018-12-13: 3.375 g via INTRAVENOUS
  Filled 2018-12-13: qty 50

## 2018-12-13 MED ORDER — AMOXICILLIN-POT CLAVULANATE 875-125 MG PO TABS: 1.0000 | ORAL_TABLET | Freq: Two times a day (BID) | ORAL | 0 refills | Status: AC

## 2018-12-13 MED ORDER — IOHEXOL 300 MG/ML  SOLN
100.0000 mL | Freq: Once | INTRAMUSCULAR | Status: AC | PRN
  Administered 2018-12-13: 22:00:00 100 mL via INTRAVENOUS

## 2018-12-13 NOTE — ED Provider Notes (Addendum)
[facility] Emergency Department Provider Note       Time seen: ----------------------------------------- 9:50 PM on 12/13/2018 -----------------------------------------   I have reviewed the triage vital signs and the nursing notes.  HISTORY   Chief Complaint Abdominal Pain   HPI Jamie Fisher is a 63 y.o. female with a history of asthma, fibroids, GERD, seizures who presents to the ED for right lower quadrant abdominal pain.  Patient states that the pain started 1 week ago but last night she was unable to sleep due to the pain.  She denies nausea, vomiting or diarrhea.  Pain is both in the right upper and right lower quadrants.  Current pain is 6 out of 10.  Past Medical History:  Diagnosis Date  . Allergic rhinitis   . Asthma   . Carpal tunnel syndrome   . Fibroid   . GERD (gastroesophageal reflux disease)   . Seizures (HCC)    AS A CHILD  . Stress incontinence in female     There are no active problems to display for this patient.   Past Surgical History:  Procedure Laterality Date  . COLONOSCOPY WITH PROPOFOL N/A 02/11/2016   Procedure: COLONOSCOPY WITH PROPOFOL;  Surgeon: Lollie Sails, MD;  Location: [facility] ENDOSCOPY;  Service: Endoscopy;  Laterality: N/A;  . ESOPHAGOGASTRODUODENOSCOPY N/A 08/22/2016   Procedure: ESOPHAGOGASTRODUODENOSCOPY (EGD);  Surgeon: Lollie Sails, MD;  Location: [facility] ENDOSCOPY;  Service: Endoscopy;  Laterality: N/A;  . REMOVE NODULE Right 2017   BUTTOCK  . TUBAL LIGATION      Allergies Patient has no known allergies.  Social History Social History   Tobacco Use  . Smoking status: Never Smoker  . Smokeless tobacco: Never Used  Substance Use Topics  . Alcohol use: No  . Drug use: No   Review of Systems Constitutional: Negative for fever. Cardiovascular: Negative for chest pain. Respiratory: Negative for shortness of breath. Gastrointestinal: Positive for abdominal pain Musculoskeletal: Negative  for back pain. Skin: Negative for rash. Neurological: Negative for headaches, focal weakness or numbness.  All systems negative/normal/unremarkable except as stated in the HPI  ____________________________________________   PHYSICAL EXAM:  VITAL SIGNS: ED Triage Vitals  Enc Vitals Group     BP 12/13/18 1749 (!) 129/56     Pulse Rate 12/13/18 1749 95     Resp 12/13/18 1749 16     Temp 12/13/18 1749 99.5 F (37.5 C)     Temp Source 12/13/18 1749 Oral     SpO2 12/13/18 1749 97 %     Weight 12/13/18 1750 156 lb 15.5 oz (71.2 kg)     Height 12/13/18 1750 5\' 1"  (1.549 m)     Head Circumference --      Peak Flow --      Pain Score 12/13/18 1749 6     Pain Loc --      Pain Edu? --      Excl. in [place]? --    Constitutional: Alert and oriented. Well appearing and in no distress. Eyes: Conjunctivae are normal. Normal extraocular movements. Cardiovascular: Normal rate, regular rhythm. No murmurs, rubs, or gallops. Respiratory: Normal respiratory effort without tachypnea nor retractions. Breath sounds are clear and equal bilaterally. No wheezes/rales/rhonchi. Gastrointestinal: Right upper quadrant tenderness with positive Murphy sign, also right lower quadrant tenderness.  Normal bowel sounds. Musculoskeletal: Nontender with normal range of motion in extremities. No lower extremity tenderness nor edema. Neurologic:  Normal speech and language. No gross focal neurologic deficits are appreciated.  Skin:  Skin is warm, dry and intact. No rash noted. Psychiatric: Mood and affect are normal. Speech and behavior are normal.  ____________________________________________  ED COURSE:  As part of my medical decision making, I reviewed the following data within the [place] History obtained from family if available, nursing notes, old chart and ekg, as well as notes from prior ED visits. Patient presented for abdominal pain, we will assess with labs and imaging as indicated at  this time.   Procedures  Jamie Fisher was evaluated in Emergency Department on 12/13/2018 for the symptoms described in the history of present illness. She was evaluated in the context of the global COVID-19 pandemic, which necessitated consideration that the patient might be at risk for infection with the SARS-CoV-2 virus that causes COVID-19. Institutional protocols and algorithms that pertain to the evaluation of patients at risk for COVID-19 are in a state of rapid change based on information released by regulatory bodies including the CDC and federal and state organizations. These policies and algorithms were followed during the patient's care in the ED.  ____________________________________________   LABS (pertinent positives/negatives)  Labs Reviewed  COMPREHENSIVE METABOLIC PANEL - Abnormal; Notable for the following components:      Result Value   Total Protein 8.2 (*)    AST 64 (*)    ALT 69 (*)    Alkaline Phosphatase 147 (*)    All other components within normal limits  CBC - Abnormal; Notable for the following components:   WBC 12.9 (*)    All other components within normal limits  LIPASE, BLOOD  URINALYSIS, COMPLETE (UACMP) WITH MICROSCOPIC    RADIOLOGY Images were viewed by me  CT the abdomen pelvis with contrast IMPRESSION: 1. Acute early uncomplicated sigmoid diverticulitis. 2. Mildly dilated appendix with some mild mucosal hyperenhancement. Findings are equivocal for acute cholecystitis. Correlation with physical exam is recommended. 3. Hepatic steatosis. 4. Probable punctate nonobstructing stone in lower pole the right kidney. 5. Fibroid uterus  ____________________________________________   DIFFERENTIAL DIAGNOSIS   Cholecystitis, biliary colic, UTI, pyelonephritis, renal colic, appendicitis, ovarian cyst  FINAL ASSESSMENT AND PLAN  Abdominal pain   Plan: The patient had presented for right-sided abdominal pain. Patient's labs did reveal some  leukocytosis as well as elevated AST and ALT as well as alkaline phosphatase. Patient's imaging revealed multiple abnormalities.  Patient has sigmoid diverticulitis, possible appendicitis, possible cholecystitis on CT.  Due to the multitude of findings, I have ordered IV Zosyn.  CT scan was discussed with Dr. Hampton Abbot who does not feel like she has appendicitis or cholecystitis.  She does have mild diverticulitis.  She will be discharged with Augmentin and will follow-up with Dr. Hampton Abbot on July 1.  She is advised to return for worsening or worrisome symptoms.   Laurence Aly, MD    Note: This note was generated in part or whole with voice recognition software. Voice recognition is usually quite accurate but there are transcription errors that can and very often do occur. I apologize for any typographical errors that were not detected and corrected.     Earleen Newport, MD 12/13/18 2258    Earleen Newport, MD 12/13/18 2325

## 2018-12-13 NOTE — ED Triage Notes (Signed)
Pt to ED via POV c/o RLQ abd pain. Pt states that the pain started 1 week ago but last night she was unable to sleep due to the pain. Pt denies N/V/D. Pt is in NAD.

## 2018-12-13 NOTE — ED Notes (Signed)
ED Provider at bedside. 

## 2018-12-13 NOTE — ED Notes (Signed)
Patient to CT.

## 2018-12-13 NOTE — ED Notes (Signed)
MD in with patient. Using interpreter number [id number].

## 2018-12-25 ENCOUNTER — Ambulatory Visit: Payer: Self-pay | Admitting: Surgery

## 2019-03-06 ENCOUNTER — Encounter: Payer: Self-pay | Admitting: *Deleted

## 2020-01-16 ENCOUNTER — Other Ambulatory Visit: Payer: Self-pay | Admitting: Family Medicine

## 2020-01-16 DIAGNOSIS — Z1231 Encounter for screening mammogram for malignant neoplasm of breast: Secondary | ICD-10-CM

## 2020-03-11 ENCOUNTER — Other Ambulatory Visit: Payer: Self-pay | Admitting: Internal Medicine

## 2020-03-11 DIAGNOSIS — R1084 Generalized abdominal pain: Secondary | ICD-10-CM

## 2020-03-30 ENCOUNTER — Other Ambulatory Visit: Payer: Self-pay

## 2020-03-30 ENCOUNTER — Ambulatory Visit
Admission: RE | Admit: 2020-03-30 | Discharge: 2020-03-30 | Disposition: A | Discharge status: Home / Self Care | Payer: Medicare Other | Source: Ambulatory Visit | Attending: Internal Medicine | Admitting: Internal Medicine

## 2020-03-30 DIAGNOSIS — R1084 Generalized abdominal pain: Secondary | ICD-10-CM | POA: Insufficient documentation

## 2020-03-30 MED ORDER — IOHEXOL 300 MG/ML  SOLN
100.0000 mL | Freq: Once | INTRAMUSCULAR | Status: AC | PRN
  Administered 2020-03-30: 100 mL via INTRAVENOUS

## 2020-04-19 DIAGNOSIS — M7672 Peroneal tendinitis, left leg: Secondary | ICD-10-CM | POA: Diagnosis not present

## 2020-04-19 DIAGNOSIS — M722 Plantar fascial fibromatosis: Secondary | ICD-10-CM | POA: Diagnosis not present

## 2020-06-15 DIAGNOSIS — Z1211 Encounter for screening for malignant neoplasm of colon: Secondary | ICD-10-CM | POA: Diagnosis not present

## 2020-06-15 DIAGNOSIS — Z1389 Encounter for screening for other disorder: Secondary | ICD-10-CM | POA: Diagnosis not present

## 2020-06-15 DIAGNOSIS — J309 Allergic rhinitis, unspecified: Secondary | ICD-10-CM | POA: Diagnosis not present

## 2020-06-15 DIAGNOSIS — R739 Hyperglycemia, unspecified: Secondary | ICD-10-CM | POA: Diagnosis not present

## 2020-06-15 DIAGNOSIS — R1011 Right upper quadrant pain: Secondary | ICD-10-CM | POA: Diagnosis not present

## 2020-06-15 DIAGNOSIS — R1031 Right lower quadrant pain: Secondary | ICD-10-CM | POA: Diagnosis not present

## 2020-06-16 DIAGNOSIS — R739 Hyperglycemia, unspecified: Secondary | ICD-10-CM | POA: Diagnosis not present

## 2020-06-16 DIAGNOSIS — R1011 Right upper quadrant pain: Secondary | ICD-10-CM | POA: Diagnosis not present

## 2020-06-16 DIAGNOSIS — Z1211 Encounter for screening for malignant neoplasm of colon: Secondary | ICD-10-CM | POA: Diagnosis not present

## 2020-07-13 DIAGNOSIS — K7689 Other specified diseases of liver: Secondary | ICD-10-CM | POA: Diagnosis not present

## 2020-07-13 DIAGNOSIS — R1031 Right lower quadrant pain: Secondary | ICD-10-CM | POA: Diagnosis not present

## 2020-07-13 DIAGNOSIS — D259 Leiomyoma of uterus, unspecified: Secondary | ICD-10-CM | POA: Diagnosis not present

## 2020-07-13 DIAGNOSIS — N858 Other specified noninflammatory disorders of uterus: Secondary | ICD-10-CM | POA: Diagnosis not present

## 2020-07-13 DIAGNOSIS — R932 Abnormal findings on diagnostic imaging of liver and biliary tract: Secondary | ICD-10-CM | POA: Diagnosis not present

## 2020-07-20 DIAGNOSIS — Z719 Counseling, unspecified: Secondary | ICD-10-CM | POA: Diagnosis not present

## 2020-07-20 DIAGNOSIS — K5900 Constipation, unspecified: Secondary | ICD-10-CM | POA: Diagnosis not present

## 2020-08-02 DIAGNOSIS — R7989 Other specified abnormal findings of blood chemistry: Secondary | ICD-10-CM | POA: Diagnosis not present

## 2020-08-02 DIAGNOSIS — Z1159 Encounter for screening for other viral diseases: Secondary | ICD-10-CM | POA: Diagnosis not present

## 2020-08-02 DIAGNOSIS — K2100 Gastro-esophageal reflux disease with esophagitis, without bleeding: Secondary | ICD-10-CM | POA: Diagnosis not present

## 2020-08-02 DIAGNOSIS — Z114 Encounter for screening for human immunodeficiency virus [HIV]: Secondary | ICD-10-CM | POA: Diagnosis not present

## 2020-08-02 DIAGNOSIS — E119 Type 2 diabetes mellitus without complications: Secondary | ICD-10-CM | POA: Diagnosis not present

## 2020-08-02 DIAGNOSIS — E782 Mixed hyperlipidemia: Secondary | ICD-10-CM | POA: Diagnosis not present

## 2020-08-02 DIAGNOSIS — R1011 Right upper quadrant pain: Secondary | ICD-10-CM | POA: Diagnosis not present

## 2020-08-04 ENCOUNTER — Other Ambulatory Visit: Payer: Self-pay | Admitting: Internal Medicine

## 2020-08-04 DIAGNOSIS — R350 Frequency of micturition: Secondary | ICD-10-CM

## 2020-08-04 DIAGNOSIS — R1011 Right upper quadrant pain: Secondary | ICD-10-CM

## 2020-08-16 DIAGNOSIS — Z1211 Encounter for screening for malignant neoplasm of colon: Secondary | ICD-10-CM | POA: Diagnosis not present

## 2020-08-16 DIAGNOSIS — R109 Unspecified abdominal pain: Secondary | ICD-10-CM | POA: Diagnosis not present

## 2020-08-16 DIAGNOSIS — K2270 Barrett's esophagus without dysplasia: Secondary | ICD-10-CM | POA: Diagnosis not present

## 2020-08-16 DIAGNOSIS — G8929 Other chronic pain: Secondary | ICD-10-CM | POA: Diagnosis not present

## 2020-08-16 DIAGNOSIS — Z789 Other specified health status: Secondary | ICD-10-CM | POA: Diagnosis not present

## 2020-08-16 DIAGNOSIS — K760 Fatty (change of) liver, not elsewhere classified: Secondary | ICD-10-CM | POA: Diagnosis not present

## 2020-08-16 DIAGNOSIS — R7989 Other specified abnormal findings of blood chemistry: Secondary | ICD-10-CM | POA: Diagnosis not present

## 2020-08-16 DIAGNOSIS — K2100 Gastro-esophageal reflux disease with esophagitis, without bleeding: Secondary | ICD-10-CM | POA: Diagnosis not present

## 2020-08-25 ENCOUNTER — Ambulatory Visit
Admission: RE | Admit: 2020-08-25 | Discharge: 2020-08-25 | Disposition: A | Discharge status: Home / Self Care | Payer: Medicare HMO | Source: Ambulatory Visit | Attending: Internal Medicine | Admitting: Internal Medicine

## 2020-08-25 ENCOUNTER — Other Ambulatory Visit: Payer: Self-pay

## 2020-08-25 DIAGNOSIS — R3589 Other polyuria: Secondary | ICD-10-CM | POA: Diagnosis not present

## 2020-08-25 DIAGNOSIS — R109 Unspecified abdominal pain: Secondary | ICD-10-CM | POA: Diagnosis not present

## 2020-08-25 DIAGNOSIS — R350 Frequency of micturition: Secondary | ICD-10-CM | POA: Insufficient documentation

## 2020-08-25 DIAGNOSIS — R1011 Right upper quadrant pain: Secondary | ICD-10-CM | POA: Insufficient documentation

## 2020-08-25 MED ORDER — TECHNETIUM TC 99M MEBROFENIN IV KIT
5.3400 | PACK | Freq: Once | INTRAVENOUS | Status: AC | PRN
  Administered 2020-08-25: 5.34 via INTRAVENOUS

## 2020-11-26 DIAGNOSIS — M25512 Pain in left shoulder: Secondary | ICD-10-CM | POA: Diagnosis not present

## 2020-11-26 DIAGNOSIS — R252 Cramp and spasm: Secondary | ICD-10-CM | POA: Diagnosis not present

## 2020-11-26 DIAGNOSIS — M25511 Pain in right shoulder: Secondary | ICD-10-CM | POA: Diagnosis not present

## 2020-11-26 DIAGNOSIS — M19011 Primary osteoarthritis, right shoulder: Secondary | ICD-10-CM | POA: Diagnosis not present

## 2020-11-26 DIAGNOSIS — E119 Type 2 diabetes mellitus without complications: Secondary | ICD-10-CM | POA: Diagnosis not present

## 2020-11-26 DIAGNOSIS — M542 Cervicalgia: Secondary | ICD-10-CM | POA: Diagnosis not present

## 2020-12-16 DIAGNOSIS — R42 Dizziness and giddiness: Secondary | ICD-10-CM | POA: Diagnosis not present

## 2020-12-16 DIAGNOSIS — L258 Unspecified contact dermatitis due to other agents: Secondary | ICD-10-CM | POA: Diagnosis not present

## 2020-12-16 DIAGNOSIS — Z712 Person consulting for explanation of examination or test findings: Secondary | ICD-10-CM | POA: Diagnosis not present

## 2020-12-16 DIAGNOSIS — G629 Polyneuropathy, unspecified: Secondary | ICD-10-CM | POA: Diagnosis not present

## 2020-12-16 DIAGNOSIS — R739 Hyperglycemia, unspecified: Secondary | ICD-10-CM | POA: Diagnosis not present

## 2021-01-06 DIAGNOSIS — M810 Age-related osteoporosis without current pathological fracture: Secondary | ICD-10-CM | POA: Diagnosis not present

## 2021-01-06 DIAGNOSIS — Z13820 Encounter for screening for osteoporosis: Secondary | ICD-10-CM | POA: Diagnosis not present

## 2021-01-06 DIAGNOSIS — Z1231 Encounter for screening mammogram for malignant neoplasm of breast: Secondary | ICD-10-CM | POA: Diagnosis not present

## 2021-01-06 DIAGNOSIS — N958 Other specified menopausal and perimenopausal disorders: Secondary | ICD-10-CM | POA: Diagnosis not present

## 2021-04-11 DIAGNOSIS — M810 Age-related osteoporosis without current pathological fracture: Secondary | ICD-10-CM | POA: Diagnosis not present

## 2021-04-11 DIAGNOSIS — Z1389 Encounter for screening for other disorder: Secondary | ICD-10-CM | POA: Diagnosis not present

## 2021-04-11 DIAGNOSIS — K760 Fatty (change of) liver, not elsewhere classified: Secondary | ICD-10-CM | POA: Diagnosis not present

## 2021-04-11 DIAGNOSIS — R1011 Right upper quadrant pain: Secondary | ICD-10-CM | POA: Diagnosis not present

## 2021-04-11 DIAGNOSIS — Z0000 Encounter for general adult medical examination without abnormal findings: Secondary | ICD-10-CM | POA: Diagnosis not present

## 2021-04-11 DIAGNOSIS — G629 Polyneuropathy, unspecified: Secondary | ICD-10-CM | POA: Diagnosis not present

## 2021-04-11 DIAGNOSIS — Z23 Encounter for immunization: Secondary | ICD-10-CM | POA: Diagnosis not present

## 2021-05-18 DIAGNOSIS — Z124 Encounter for screening for malignant neoplasm of cervix: Secondary | ICD-10-CM | POA: Diagnosis not present

## 2021-05-18 DIAGNOSIS — N393 Stress incontinence (female) (male): Secondary | ICD-10-CM | POA: Diagnosis not present

## 2021-05-18 DIAGNOSIS — Z0000 Encounter for general adult medical examination without abnormal findings: Secondary | ICD-10-CM | POA: Diagnosis not present

## 2021-06-20 DIAGNOSIS — M25512 Pain in left shoulder: Secondary | ICD-10-CM | POA: Diagnosis not present

## 2021-06-20 DIAGNOSIS — R1011 Right upper quadrant pain: Secondary | ICD-10-CM | POA: Diagnosis not present

## 2021-06-20 DIAGNOSIS — K760 Fatty (change of) liver, not elsewhere classified: Secondary | ICD-10-CM | POA: Diagnosis not present

## 2021-06-20 DIAGNOSIS — M546 Pain in thoracic spine: Secondary | ICD-10-CM | POA: Diagnosis not present

## 2021-06-20 DIAGNOSIS — M549 Dorsalgia, unspecified: Secondary | ICD-10-CM | POA: Diagnosis not present

## 2021-06-20 DIAGNOSIS — R079 Chest pain, unspecified: Secondary | ICD-10-CM | POA: Diagnosis not present

## 2021-06-21 DIAGNOSIS — R1011 Right upper quadrant pain: Secondary | ICD-10-CM | POA: Diagnosis not present

## 2021-07-30 IMAGING — NM NM HEPATO W/GB/PHARM/[PERSON_NAME]
2 series · 12 of 12 positions shown · non-contrast
Comparison: CT dated 03/30/2020.  05/23/2016

CLINICAL DATA: Abdominal pain.

EXAM:
NUCLEAR MEDICINE HEPATOBILIARY IMAGING WITH GALLBLADDER EF
TECHNIQUE: Sequential images of the abdomen were obtained [DATE] minutes
following intravenous administration of radiopharmaceutical. After
oral ingestion of Ensure, gallbladder ejection fraction was
determined. At 60 min, normal ejection fraction is greater than 33%.
RADIOPHARMACEUTICALS:  5.34 mCi Wc-HHm  Choletec IV

[Series 1000: hepatobiliary scan · 9.59mm/px · 6 of 60 frames shown]
[frame 6/60]
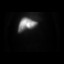
[frame 16/60]
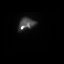
[frame 26/60]
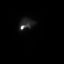
[frame 36/60]
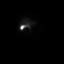
[frame 46/60]
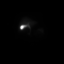
[frame 56/60]
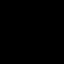

[Series 1000: gallbladder ef · 4.80mm/px · 6 of 120 frames shown]
[frame 11/120]
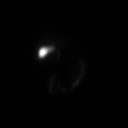
[frame 31/120]
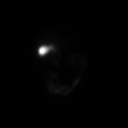
[frame 51/120]
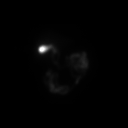
[frame 71/120]
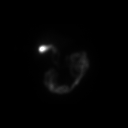
[frame 91/120]
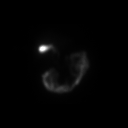
[frame 111/120]
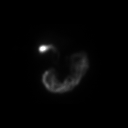

[12 of 12 positions shown; findings below may reference images not displayed]

FINDINGS: Prompt uptake and biliary excretion of activity by the liver is
seen. Gallbladder activity is visualized, consistent with patency of
cystic duct. Biliary activity passes into small bowel, consistent
with patent common bile duct.

Calculated gallbladder ejection fraction is 71%. (Normal gallbladder
ejection fraction with Ensure is greater than 33%.)
IMPRESSION: Normal study.

## 2021-09-19 ENCOUNTER — Emergency Department
Admission: EM | Admit: 2021-09-19 | Discharge: 2021-09-19 | Disposition: A | Discharge status: Home / Self Care | Payer: Medicare HMO | Attending: Emergency Medicine | Admitting: Emergency Medicine

## 2021-09-19 ENCOUNTER — Emergency Department: Payer: Medicare HMO

## 2021-09-19 ENCOUNTER — Other Ambulatory Visit: Payer: Self-pay

## 2021-09-19 DIAGNOSIS — R109 Unspecified abdominal pain: Secondary | ICD-10-CM | POA: Diagnosis not present

## 2021-09-19 DIAGNOSIS — F419 Anxiety disorder, unspecified: Secondary | ICD-10-CM | POA: Diagnosis not present

## 2021-09-19 DIAGNOSIS — E119 Type 2 diabetes mellitus without complications: Secondary | ICD-10-CM | POA: Insufficient documentation

## 2021-09-19 DIAGNOSIS — R7989 Other specified abnormal findings of blood chemistry: Secondary | ICD-10-CM | POA: Insufficient documentation

## 2021-09-19 DIAGNOSIS — R1031 Right lower quadrant pain: Secondary | ICD-10-CM | POA: Insufficient documentation

## 2021-09-19 DIAGNOSIS — R1030 Lower abdominal pain, unspecified: Secondary | ICD-10-CM

## 2021-09-19 DIAGNOSIS — D72829 Elevated white blood cell count, unspecified: Secondary | ICD-10-CM | POA: Diagnosis not present

## 2021-09-19 DIAGNOSIS — J45909 Unspecified asthma, uncomplicated: Secondary | ICD-10-CM | POA: Diagnosis not present

## 2021-09-19 DIAGNOSIS — R748 Abnormal levels of other serum enzymes: Secondary | ICD-10-CM | POA: Diagnosis not present

## 2021-09-19 DIAGNOSIS — R9431 Abnormal electrocardiogram [ECG] [EKG]: Secondary | ICD-10-CM | POA: Diagnosis not present

## 2021-09-19 LAB — COMPREHENSIVE METABOLIC PANEL
ALT: 53 U/L — ABNORMAL HIGH (ref 0–44)
AST: 69 U/L — ABNORMAL HIGH (ref 15–41)
Albumin: 4.3 g/dL (ref 3.5–5.0)
Alkaline Phosphatase: 132 U/L — ABNORMAL HIGH (ref 38–126)
Anion gap: 9 (ref 5–15)
BUN: 13 mg/dL (ref 8–23)
CO2: 26 mmol/L (ref 22–32)
Calcium: 9.7 mg/dL (ref 8.9–10.3)
Chloride: 101 mmol/L (ref 98–111)
Creatinine, Ser: 0.78 mg/dL (ref 0.44–1.00)
GFR, Estimated: >60 mL/min (ref >60)
Glucose, Bld: 168 mg/dL — ABNORMAL HIGH (ref 70–99)
Potassium: 3.7 mmol/L (ref 3.5–5.1)
Sodium: 136 mmol/L (ref 135–145)
Total Bilirubin: 0.6 mg/dL (ref 0.3–1.2)
Total Protein: 8.7 g/dL — ABNORMAL HIGH (ref 6.5–8.1)

## 2021-09-19 LAB — CBC
HCT: 41.6 % (ref 36.0–46.0)
Hemoglobin: 14.4 g/dL (ref 12.0–15.0)
MCH: 29.4 pg (ref 26.0–34.0)
MCHC: 34.6 g/dL (ref 30.0–36.0)
MCV: 85.1 fL (ref 80.0–100.0)
Platelets: 344 10*3/uL (ref 150–400)
RBC: 4.89 MIL/uL (ref 3.87–5.11)
RDW: 13 % (ref 11.5–15.5)
WBC: 13.8 10*3/uL — ABNORMAL HIGH (ref 4.0–10.5)
nRBC: 0 % (ref 0.0–0.2)

## 2021-09-19 LAB — URINALYSIS, ROUTINE W REFLEX MICROSCOPIC
Bilirubin Urine: NEGATIVE
Glucose, UA: NEGATIVE mg/dL
Hgb urine dipstick: NEGATIVE
Ketones, ur: NEGATIVE mg/dL
Leukocytes,Ua: NEGATIVE
Nitrite: NEGATIVE
Protein, ur: NEGATIVE mg/dL
Specific Gravity, Urine: >1.046 — ABNORMAL HIGH (ref 1.005–1.030)
pH: 9 — ABNORMAL HIGH (ref 5.0–8.0)

## 2021-09-19 LAB — LIPASE, BLOOD: Lipase: 50 U/L (ref 11–51)

## 2021-09-19 MED ORDER — DICYCLOMINE HCL 10 MG PO CAPS
10.0000 mg | ORAL_CAPSULE | Freq: Once | ORAL | Status: AC
  Administered 2021-09-19: 10 mg via ORAL
  Filled 2021-09-19: qty 1

## 2021-09-19 MED ORDER — KETOROLAC TROMETHAMINE 30 MG/ML IJ SOLN
15.0000 mg | Freq: Once | INTRAMUSCULAR | Status: AC
  Administered 2021-09-19: 15 mg via INTRAVENOUS
  Filled 2021-09-19: qty 1

## 2021-09-19 MED ORDER — ONDANSETRON HCL 4 MG/2ML IJ SOLN
4.0000 mg | Freq: Once | INTRAMUSCULAR | Status: AC
  Administered 2021-09-19: 4 mg via INTRAVENOUS
  Filled 2021-09-19: qty 2

## 2021-09-19 MED ORDER — IBUPROFEN 600 MG PO TABS: 600.0000 mg | ORAL_TABLET | Freq: Four times a day (QID) | ORAL | 0 refills | Status: AC | PRN

## 2021-09-19 MED ORDER — DICYCLOMINE HCL 10 MG PO CAPS: 10.0000 mg | ORAL_CAPSULE | Freq: Three times a day (TID) | ORAL | 0 refills | Status: AC | PRN

## 2021-09-19 MED ORDER — HYDROMORPHONE HCL 1 MG/ML IJ SOLN
1.0000 mg | Freq: Once | INTRAMUSCULAR | Status: AC
  Administered 2021-09-19: 1 mg via INTRAVENOUS
  Filled 2021-09-19: qty 1

## 2021-09-19 MED ORDER — TRAMADOL HCL 50 MG PO TABS
50.0000 mg | ORAL_TABLET | Freq: Once | ORAL | Status: AC
  Administered 2021-09-19: 50 mg via ORAL
  Filled 2021-09-19: qty 1

## 2021-09-19 MED ORDER — IOHEXOL 300 MG/ML  SOLN
100.0000 mL | Freq: Once | INTRAMUSCULAR | Status: AC | PRN
Start: 2021-09-19 — End: 2021-09-19
  Administered 2021-09-19: 100 mL via INTRAVENOUS

## 2021-09-19 MED ORDER — KETOROLAC TROMETHAMINE 30 MG/ML IJ SOLN: 30.0000 mg | Freq: Once | INTRAMUSCULAR | Status: DC

## 2021-09-19 MED ORDER — POLYETHYLENE GLYCOL 3350 17 G PO PACK: 17.0000 g | PACK | Freq: Every day | ORAL | 0 refills | Status: AC

## 2021-09-19 MED ORDER — SODIUM CHLORIDE 0.9 % IV BOLUS
500.0000 mL | Freq: Once | INTRAVENOUS | Status: AC
  Administered 2021-09-19: 500 mL via INTRAVENOUS

## 2021-09-19 NOTE — ED Notes (Signed)
Pt transported to CT at this time.

## 2021-09-19 NOTE — ED Triage Notes (Signed)
BP right arm 118/71. BP left arm 116/91 ?

## 2021-09-19 NOTE — ED Provider Notes (Signed)
? ?[facility] ?Provider Note ? ? ? Event Date/Time  ? First MD Initiated Contact with Patient 09/19/21 0043   ?  (approximate) ? ? ?History  ? ?Abdominal Pain ? ? ?HPI ? ?Jamie Fisher is a 66 y.o. female with history of GERD, asthma, type II DM, lumbosacral degenerative joint disease, and hyperlipidemia who presents with acute onset of right lower quadrant pain around 6 PM today, radiating to her lower back and to the other side, severe intensity.  The patient initially stated that she had never had similar pain before, but the family states that she has had this pain chronically and has had multiple ED visits here and at other ED's for similar symptoms.  The patient denies pain in her chest or upper back.  She has no shortness of breath.  She has not had any vomiting or diarrhea.  She denies urinary symptoms. ? ? ? ?Physical Exam  ? ?Triage Vital Signs: ?ED Triage Vitals  ?Enc Vitals Group  ?   BP 09/19/21 0025 118/71  ?   Pulse Rate 09/19/21 0025 (!) 104  ?   Resp 09/19/21 0025 (!) 22  ?   Temp 09/19/21 0029 99.1 ?F (37.3 ?C)  ?   Temp Source 09/19/21 0029 Oral  ?   SpO2 09/19/21 0025 96 %  ?   Weight 09/19/21 0027 160 lb (72.6 kg)  ?   Height 09/19/21 0027 '5\' 1"'  (1.549 m)  ?   Head Circumference --   ?   Peak Flow --   ?   Pain Score 09/19/21 0027 10  ?   Pain Loc --   ?   Pain Edu? --   ?   Excl. in [place]? --   ? ? ?Most recent vital signs: ?Vitals:  ? 09/19/21 0508 09/19/21 0530  ?BP: (!) 143/74 114/64  ?Pulse: 81 73  ?Resp: 19 11  ?Temp:    ?SpO2: 98% 97%  ? ? ? ?General:  Alert and oriented, anxious and uncomfortable appearing. ?CV:  Good peripheral perfusion.  ?Resp:  Normal effort.  ?Abd:  Soft with moderate right lower quadrant tenderness.  No distention.  ?Other:  No CVA tenderness.  No scleral icterus or jaundice. ? ? ?ED Results / Procedures / Treatments  ? ?Labs ?(all labs ordered are listed, but only abnormal results are displayed) ?Labs Reviewed  ?COMPREHENSIVE METABOLIC PANEL -  Abnormal; Notable for the following components:  ?    Result Value  ? Glucose, Bld 168 (*)   ? Total Protein 8.7 (*)   ? AST 69 (*)   ? ALT 53 (*)   ? Alkaline Phosphatase 132 (*)   ? All other components within normal limits  ?CBC - Abnormal; Notable for the following components:  ? WBC 13.8 (*)   ? All other components within normal limits  ?URINALYSIS, ROUTINE W REFLEX MICROSCOPIC - Abnormal; Notable for the following components:  ? Color, Urine YELLOW (*)   ? APPearance CLEAR (*)   ? Specific Gravity, Urine >1.046 (*)   ? pH 9.0 (*)   ? All other components within normal limits  ?LIPASE, BLOOD  ? ? ? ?EKG ? ?ED ECG REPORT ?IArta Silence, the attending physician, personally viewed and interpreted this ECG. ? ?Date: 09/19/2021 ?EKG Time: 0100 ?Rate: 69 ?Rhythm: normal sinus rhythm (incorrectly read by machine as atrial fibrillation) ?QRS Axis: normal ?Intervals: normal ?ST/T Wave abnormalities: normal ?Narrative Interpretation: no evidence of acute ischemia ? ? ? ?  RADIOLOGY ? ?CT abdomen/pelvis: I independently viewed and interpreted the images; there are no acute abnormalities.  Aorta appears normal caliber.  Radiology impression is as follows: ? ?IMPRESSION:  ?No acute findings in the abdomen or pelvis.  ?   ?Fatty liver.  ?   ?Fibroid uterus.  ? ? ?PROCEDURES: ? ?Critical Care performed: No ? ?Procedures ? ? ?MEDICATIONS ORDERED IN ED: ?Medications  ?traMADol (ULTRAM) tablet 50 mg (has no administration in time range)  ?dicyclomine (BENTYL) capsule 10 mg (has no administration in time range)  ?ondansetron [facility]) injection 4 mg (4 mg Intravenous Given 09/19/21 0048)  ?HYDROmorphone (DILAUDID) injection 1 mg (1 mg Intravenous Given 09/19/21 0048)  ?sodium chloride 0.9 % bolus 500 mL (0 mLs Intravenous Stopped 09/19/21 0209)  ?iohexol (OMNIPAQUE) 300 MG/ML solution 100 mL (100 mLs Intravenous Contrast Given 09/19/21 0146)  ?ketorolac (TORADOL) 30 MG/ML injection 15 mg (15 mg Intravenous Given 09/19/21 0216)   ? ? ? ?IMPRESSION / MDM / ASSESSMENT AND PLAN / ED COURSE  ?I reviewed the triage vital signs and the nursing notes. ? ?66 year old female with PMH as noted above presents with acute onset of primarily right sided abdominal, flank, and low back pain over the last 6 hours with no vomiting or diarrhea.  Family reports that the patient has had this pain chronically. ? ?I reviewed the past medical records; the patient was most recently seen in our ED on 12/13/2018 with a similar presentation.  Work-up at that time showed leukocytosis, elevated LFTs, and multiple findings on CT including possible diverticulitis and appendicitis, although overall clinical assessment was that she had mild diverticulitis.  She was sent home on antibiotics.  More recently, the patient was seen at the [facility] ED on 06/20/2021 with a similar presentation.  CT at that time showed hepatic steatosis, fat-containing umbilical hernia, possible fibroids, but no acute findings.  Abdominal ultrasound from 06/23/2020 showed no gallstones or biliary dilatation. ? ?Differential diagnosis includes, but is not limited to, exacerbation of chronic abdominal pain, appendicitis, diverticulitis, colitis, bowel obstruction, volvulus, UTI/pyelonephritis, musculoskeletal pain.  I overall have a low suspicion for vascular etiology.  I did initially consider whether this could be a AAA rupture or renal infarct, however given the additional history both from the family members and chart and the chronic nature of the symptoms, and the lack of AAA on prior imaging, there is no evidence at this time. ? ?We will give analgesia, obtain CT abdomen/pelvis, lab work-up, and reassess.  The patient is on the cardiac monitor to evaluate for evidence of arrhythmia and/or significant heart rate changes. ? ?----------------------------------------- ?6:01 AM on 09/19/2021 ?----------------------------------------- ? ?Lab work-up is overall reassuring.  WBC count is minimally elevated,  which appears to be a chronic finding for the patient and is likely reactive.  LFTs and alk phos are minimally elevated.  This is also consistent with the patient's most recent labs here from 2 years ago (though when the patient was seen at [facility] in January, only the AST was elevated at that time).  Chemistry is otherwise unremarkable. ? ?Urinalysis is negative.  Lipase is normal. ? ?CT shows fatty liver which is chronic, but no acute findings. ? ?The patient had good relief with Dilaudid initially.  She had some recurrent pain a few hours later and got a small dose of Toradol with significant improvement.  She has now not required any additional pain medication for 4 hours.  I did consider whether the patient would benefit from observation admission, however  given that she has significantly improved, has a negative work-up, and is tolerating p.o., there is no specific indication for admission at this time.  The patient states she feels better and is comfortable going home. ? ?Overall this appears to be an exacerbation of the patient's chronic abdominal pain and may be related to GI dysmotility, or possible gastroenteritis.  I see that the patient was prescribed Bentyl and MiraLAX at her most recent ED visit at [facility] a few months ago, however the patient states that she never got these medications and has not taken anything at home. ? ?I will therefore prescribe Bentyl, MiraLAX, and ibuprofen.  I counseled the patient and her family member on the results of the work-up and plan of care via the video interpreter.  I answered all of their questions.  I gave the return precautions and they expressed understanding. ? ? ? ?FINAL CLINICAL IMPRESSION(S) / ED DIAGNOSES  ? ?Final diagnoses:  ?Lower abdominal pain  ? ? ? ?Rx / DC Orders  ? ?ED Discharge Orders   ? ?      Ordered  ?  dicyclomine (BENTYL) 10 MG capsule  3 times daily PRN       ? 09/19/21 0601  ?  ibuprofen (ADVIL) 600 MG tablet  Every 6 hours PRN       ? 09/19/21  0601  ?  polyethylene glycol (MIRALAX) 17 g packet  Daily       ? 09/19/21 0601  ? ?  ?  ? ?  ? ? ? ?Note:  This document was prepared using Systems analyst and may include unintentional dicta

## 2021-09-19 NOTE — ED Triage Notes (Signed)
RLQ abd pain, radiating to back. Sudden onset apx 6pm tonight. Sharp, twisting in nature. Constant. Denies N/V. Pt visibly distressed in triage. Unable to sit still. Diaphoretic.  ?

## 2021-11-01 ENCOUNTER — Other Ambulatory Visit: Payer: Self-pay

## 2021-11-01 ENCOUNTER — Emergency Department: Payer: Medicare HMO

## 2021-11-01 ENCOUNTER — Emergency Department
Admission: EM | Admit: 2021-11-01 | Discharge: 2021-11-01 | Disposition: A | Discharge status: Home / Self Care | Payer: Medicare HMO | Attending: Emergency Medicine | Admitting: Emergency Medicine

## 2021-11-01 DIAGNOSIS — R42 Dizziness and giddiness: Secondary | ICD-10-CM | POA: Diagnosis not present

## 2021-11-01 DIAGNOSIS — R519 Headache, unspecified: Secondary | ICD-10-CM | POA: Diagnosis not present

## 2021-11-01 DIAGNOSIS — J45909 Unspecified asthma, uncomplicated: Secondary | ICD-10-CM | POA: Diagnosis not present

## 2021-11-01 LAB — URINALYSIS, ROUTINE W REFLEX MICROSCOPIC
Bilirubin Urine: NEGATIVE
Glucose, UA: NEGATIVE mg/dL
Ketones, ur: NEGATIVE mg/dL
Leukocytes,Ua: NEGATIVE
Nitrite: NEGATIVE
Protein, ur: NEGATIVE mg/dL
Specific Gravity, Urine: 1.006 (ref 1.005–1.030)
pH: 6 (ref 5.0–8.0)

## 2021-11-01 LAB — BASIC METABOLIC PANEL
Anion gap: 9 (ref 5–15)
BUN: 12 mg/dL (ref 8–23)
CO2: 24 mmol/L (ref 22–32)
Calcium: 9.3 mg/dL (ref 8.9–10.3)
Chloride: 105 mmol/L (ref 98–111)
Creatinine, Ser: 0.5 mg/dL (ref 0.44–1.00)
GFR, Estimated: >60 mL/min (ref >60)
Glucose, Bld: 94 mg/dL (ref 70–99)
Potassium: 4 mmol/L (ref 3.5–5.1)
Sodium: 138 mmol/L (ref 135–145)

## 2021-11-01 LAB — CBC
HCT: 43.6 % (ref 36.0–46.0)
Hemoglobin: 14.4 g/dL (ref 12.0–15.0)
MCH: 28.5 pg (ref 26.0–34.0)
MCHC: 33 g/dL (ref 30.0–36.0)
MCV: 86.3 fL (ref 80.0–100.0)
Platelets: 362 10*3/uL (ref 150–400)
RBC: 5.05 MIL/uL (ref 3.87–5.11)
RDW: 13.3 % (ref 11.5–15.5)
WBC: 7.5 10*3/uL (ref 4.0–10.5)
nRBC: 0 % (ref 0.0–0.2)

## 2021-11-01 MED ORDER — PROCHLORPERAZINE EDISYLATE 10 MG/2ML IJ SOLN
10.0000 mg | Freq: Once | INTRAMUSCULAR | Status: AC
  Administered 2021-11-01: 10 mg via INTRAVENOUS
  Filled 2021-11-01: qty 2

## 2021-11-01 MED ORDER — DIPHENHYDRAMINE HCL 50 MG/ML IJ SOLN
25.0000 mg | Freq: Once | INTRAMUSCULAR | Status: AC
  Administered 2021-11-01: 25 mg via INTRAVENOUS
  Filled 2021-11-01: qty 1

## 2021-11-01 MED ORDER — SODIUM CHLORIDE 0.9 % IV BOLUS
1000.0000 mL | Freq: Once | INTRAVENOUS | Status: AC
  Administered 2021-11-01: 1000 mL via INTRAVENOUS

## 2021-11-01 NOTE — ED Provider Notes (Signed)
? ?[facility] ?Provider Note ? ? ? Event Date/Time  ? First MD Initiated Contact with Patient 11/01/21 1531   ?  (approximate) ? ? ?History  ? ?Chief Complaint ?Dizziness ? ? ?HPI ? ?Jamie Fisher is a 66 y.o. female with past medical history of asthma and GERD who presents to the ED complaining of headache and dizziness.  History is limited as patient is Spanish-speaking only and history obtained with assistance of in person interpreter.  Patient reports that she has been dealing with 3 days of gradually worsening headache.  She states the headache is bilateral and described as a dull throbbing, typically worse overnight and when she gets up in the morning.  She reports feeling dizzy and lightheaded at times, but denies any dizziness currently.  She has not had any vision changes, speech changes, numbness, or weakness.  She took some Tylenol for her symptoms at home without relief.  She denies any fevers or neck stiffness, has never had similar symptoms in the past.  She was initially evaluated at the walk-in clinic and referred to the ED for further evaluation and possible imaging. ?  ? ? ?Physical Exam  ? ?Triage Vital Signs: ?ED Triage Vitals  ?Enc Vitals Group  ?   BP 11/01/21 1301 118/72  ?   Pulse Rate 11/01/21 1301 (!) 59  ?   Resp 11/01/21 1301 18  ?   Temp 11/01/21 1301 98 ?F (36.7 ?C)  ?   Temp src --   ?   SpO2 11/01/21 1301 97 %  ?   Weight --   ?   Height --   ?   Head Circumference --   ?   Peak Flow --   ?   Pain Score 11/01/21 1246 10  ?   Pain Loc --   ?   Pain Edu? --   ?   Excl. in [place]? --   ? ? ?Most recent vital signs: ?Vitals:  ? 11/01/21 1301 11/01/21 1827  ?BP: 118/72 (!) 136/55  ?Pulse: (!) 59 (!) 58  ?Resp: 18 20  ?Temp: 98 ?F (36.7 ?C)   ?SpO2: 97% 98%  ? ? ?Constitutional: Alert and oriented. ?Eyes: Conjunctivae are normal.  Pupils equal, round, and reactive to light bilaterally. ?Head: Atraumatic.  No temporal tenderness bilaterally. ?Nose: No  congestion/rhinnorhea. ?Mouth/Throat: Mucous membranes are moist.  ?Neck: Supple with no meningismus. ?Cardiovascular: Normal rate, regular rhythm. Grossly normal heart sounds.  2+ radial pulses bilaterally. ?Respiratory: Normal respiratory effort.  No retractions. Lungs CTAB. ?Gastrointestinal: Soft and nontender. No distention. ?Musculoskeletal: No lower extremity tenderness nor edema.  ?Neurologic:  Normal speech and language. No gross focal neurologic deficits are appreciated. ? ? ? ?ED Results / Procedures / Treatments  ? ?Labs ?(all labs ordered are listed, but only abnormal results are displayed) ?Labs Reviewed  ?URINALYSIS, ROUTINE W REFLEX MICROSCOPIC - Abnormal; Notable for the following components:  ?    Result Value  ? Color, Urine STRAW (*)   ? APPearance CLEAR (*)   ? Hgb urine dipstick SMALL (*)   ? Bacteria, UA RARE (*)   ? All other components within normal limits  ?BASIC METABOLIC PANEL  ?CBC  ?CBG MONITORING, ED  ? ? ? ?EKG ? ?ED ECG REPORT ?Tempie Hoist, the attending physician, personally viewed and interpreted this ECG. ? ? Date: 11/01/2021 ? EKG Time: 13:00 ? Rate: 67 ? Rhythm: normal sinus rhythm ? Axis: Normal ? Intervals:none ? ST&T  Change: None ? ?RADIOLOGY ?CT head reviewed and interpreted by me with no hemorrhage or midline shift. ? ?PROCEDURES: ? ?Critical Care performed: No ? ?Procedures ? ? ?MEDICATIONS ORDERED IN ED: ?Medications  ?prochlorperazine (COMPAZINE) injection 10 mg (10 mg Intravenous Given 11/01/21 1716)  ?diphenhydrAMINE (BENADRYL) injection 25 mg (25 mg Intravenous Given 11/01/21 1715)  ?sodium chloride 0.9 % bolus 1,000 mL (1,000 mLs Intravenous New Bag/Given 11/01/21 1718)  ? ? ? ?IMPRESSION / MDM / ASSESSMENT AND PLAN / ED COURSE  ?I reviewed the triage vital signs and the nursing notes. ?             ?               ? ?66 y.o. female with past medical history of asthma and GERD who presents to the ED complaining of 3 days of gradually worsening diffuse headache  with some dizziness and lightheadedness, denies vision changes, speech changes, numbness, or weakness. ? ?Differential diagnosis includes, but is not limited to, SAH, meningitis, mass, migraine, tension headache, viral syndrome. ? ?Patient well-appearing and in no acute distress, vital signs are unremarkable and she has no focal neurologic deficits on exam.  Low suspicion for [facility] given gradually worsening headache with reassuring neurologic exam.  I also doubt meningitis given she is afebrile with no neck stiffness.  We will check CT head for intracranial process, treat symptomatically with IV Compazine and Benadryl as part of migraine cocktail.  EKG shows no evidence of arrhythmia or ischemia and I doubt cardiac etiology of her dizziness.  Labs are reassuring with CBC showing no anemia or leukocytosis, BMP without electrolyte abnormality or AKI. ? ?CT head is negative for acute process, on reassessment patient reports she is feeling better and headache is improved.  Urinalysis is also unremarkable with no signs of infection.  Patient is appropriate for discharge home with PCP follow-up, was counseled to return to the ED for new worsening symptoms.  Patient agrees with plan. ? ?  ? ? ?FINAL CLINICAL IMPRESSION(S) / ED DIAGNOSES  ? ?Final diagnoses:  ?Acute nonintractable headache, unspecified headache type  ? ? ? ?Rx / DC Orders  ? ?ED Discharge Orders   ? ? None  ? ?  ? ? ? ?Note:  This document was prepared using Dragon voice recognition software and may include unintentional dictation errors. ?  ?Blake Divine, MD ?11/01/21 2018 ? ?

## 2021-11-01 NOTE — ED Triage Notes (Addendum)
Pt comes with c/o frontal headache for 3 days. Pt states some blurry vision. Pt states dizziness. ?

## 2022-01-17 DIAGNOSIS — Z1389 Encounter for screening for other disorder: Secondary | ICD-10-CM | POA: Diagnosis not present

## 2022-01-17 DIAGNOSIS — R1011 Right upper quadrant pain: Secondary | ICD-10-CM | POA: Diagnosis not present

## 2022-01-17 DIAGNOSIS — G4489 Other headache syndrome: Secondary | ICD-10-CM | POA: Diagnosis not present

## 2022-04-20 ENCOUNTER — Other Ambulatory Visit: Payer: Self-pay | Admitting: Family Medicine

## 2022-04-20 DIAGNOSIS — K2100 Gastro-esophageal reflux disease with esophagitis, without bleeding: Secondary | ICD-10-CM | POA: Diagnosis not present

## 2022-04-20 DIAGNOSIS — K22719 Barrett's esophagus with dysplasia, unspecified: Secondary | ICD-10-CM | POA: Diagnosis not present

## 2022-04-20 DIAGNOSIS — R1011 Right upper quadrant pain: Secondary | ICD-10-CM | POA: Diagnosis not present

## 2022-04-21 ENCOUNTER — Ambulatory Visit: Payer: Medicare HMO

## 2022-04-24 ENCOUNTER — Ambulatory Visit
Admission: RE | Admit: 2022-04-24 | Discharge: 2022-04-24 | Disposition: A | Discharge status: Home / Self Care | Payer: Medicare HMO | Source: Ambulatory Visit | Attending: Family Medicine | Admitting: Family Medicine

## 2022-04-24 DIAGNOSIS — R1011 Right upper quadrant pain: Secondary | ICD-10-CM | POA: Diagnosis not present

## 2022-04-24 DIAGNOSIS — K760 Fatty (change of) liver, not elsewhere classified: Secondary | ICD-10-CM | POA: Diagnosis not present

## 2022-04-24 DIAGNOSIS — K2100 Gastro-esophageal reflux disease with esophagitis, without bleeding: Secondary | ICD-10-CM | POA: Diagnosis not present

## 2022-06-06 DIAGNOSIS — R0989 Other specified symptoms and signs involving the circulatory and respiratory systems: Secondary | ICD-10-CM | POA: Diagnosis not present

## 2022-06-13 DIAGNOSIS — M25511 Pain in right shoulder: Secondary | ICD-10-CM | POA: Diagnosis not present

## 2022-06-13 DIAGNOSIS — M25512 Pain in left shoulder: Secondary | ICD-10-CM | POA: Diagnosis not present

## 2022-06-13 DIAGNOSIS — E119 Type 2 diabetes mellitus without complications: Secondary | ICD-10-CM | POA: Diagnosis not present

## 2022-08-21 DIAGNOSIS — K760 Fatty (change of) liver, not elsewhere classified: Secondary | ICD-10-CM | POA: Diagnosis not present

## 2022-08-21 DIAGNOSIS — Z1211 Encounter for screening for malignant neoplasm of colon: Secondary | ICD-10-CM | POA: Diagnosis not present

## 2022-08-21 DIAGNOSIS — K2270 Barrett's esophagus without dysplasia: Secondary | ICD-10-CM | POA: Diagnosis not present

## 2022-08-21 DIAGNOSIS — G8929 Other chronic pain: Secondary | ICD-10-CM | POA: Diagnosis not present

## 2022-08-21 DIAGNOSIS — Z789 Other specified health status: Secondary | ICD-10-CM | POA: Diagnosis not present

## 2022-08-21 DIAGNOSIS — R109 Unspecified abdominal pain: Secondary | ICD-10-CM | POA: Diagnosis not present

## 2022-08-21 DIAGNOSIS — K2100 Gastro-esophageal reflux disease with esophagitis, without bleeding: Secondary | ICD-10-CM | POA: Diagnosis not present

## 2022-08-23 DIAGNOSIS — R252 Cramp and spasm: Secondary | ICD-10-CM | POA: Diagnosis not present

## 2022-08-23 DIAGNOSIS — K0889 Other specified disorders of teeth and supporting structures: Secondary | ICD-10-CM | POA: Diagnosis not present

## 2022-08-23 DIAGNOSIS — E119 Type 2 diabetes mellitus without complications: Secondary | ICD-10-CM | POA: Diagnosis not present

## 2022-10-06 IMAGING — CT CT HEAD W/O CM
4 series · 16 of 47 positions shown, 18 images · non-contrast
Comparison: None Available.

CLINICAL DATA: Headache.



[Series 2: head bone · axial · 0.39mm/px · z∈[-152,-124]mm · 3 of 71 slices shown]
[im 8/71  bone]
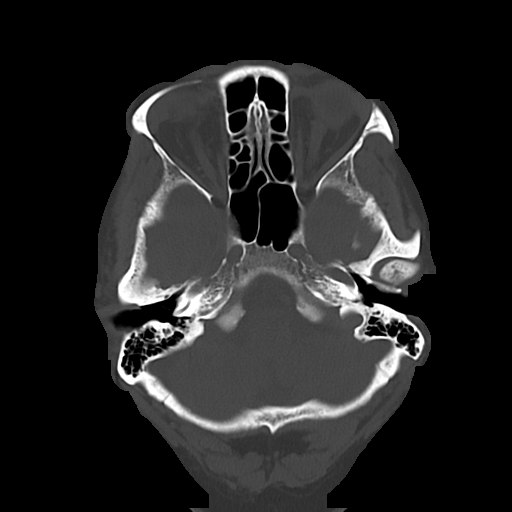
[im 15/71  bone]
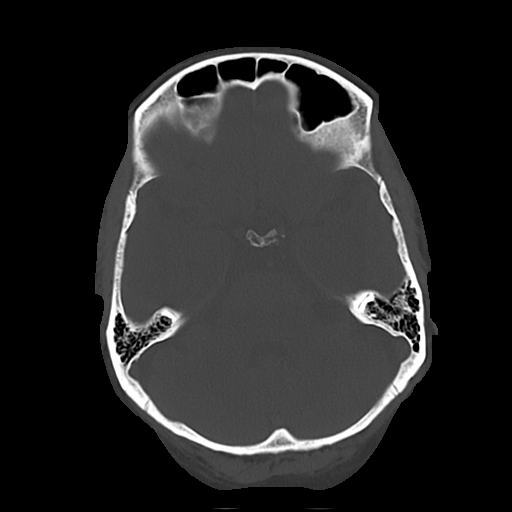
[im 22/71  bone]
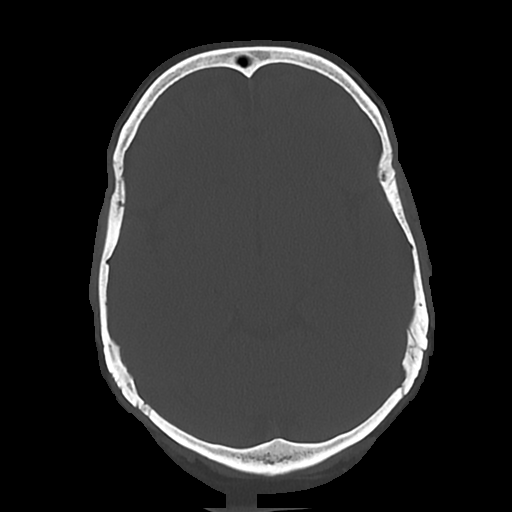

[Series 3: head wo · axial · 0.39mm/px · z∈[-152,-46]mm · 7 of 29 slices shown, 9 images]
[im 4/29  brain]
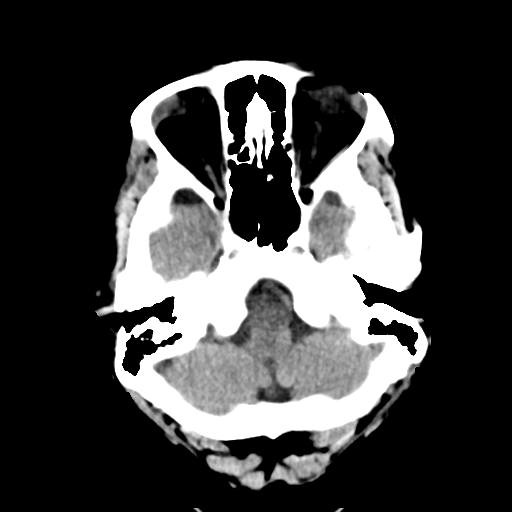
[im 4/29  bone]
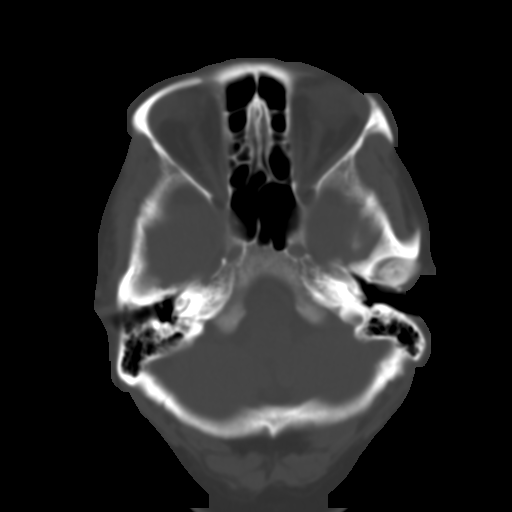
[im 8/29  brain]
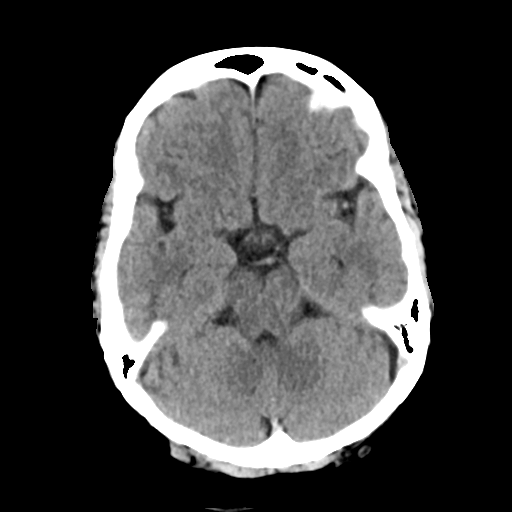
[im 11/29  brain]
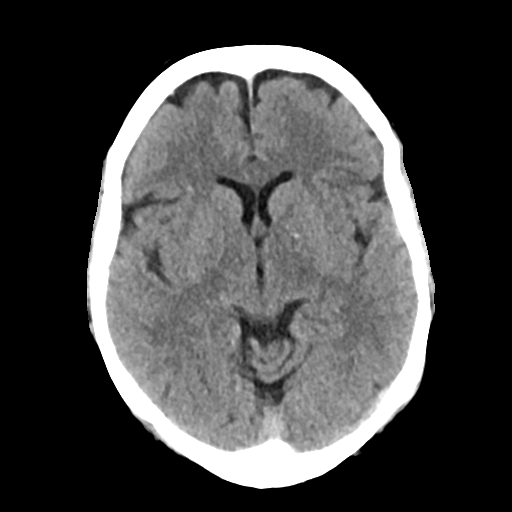
[im 15/29  brain]
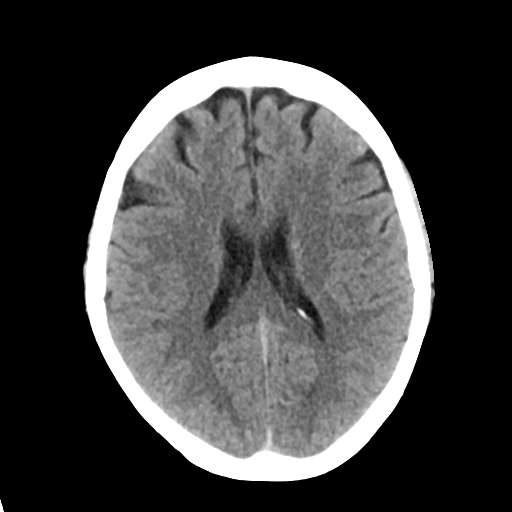
[im 18/29  brain]
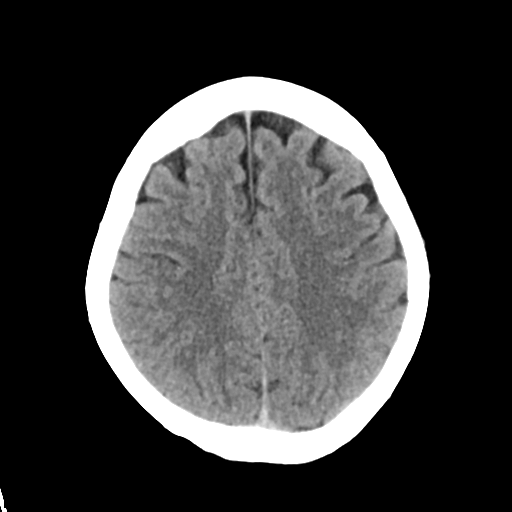
[im 18/29  bone]
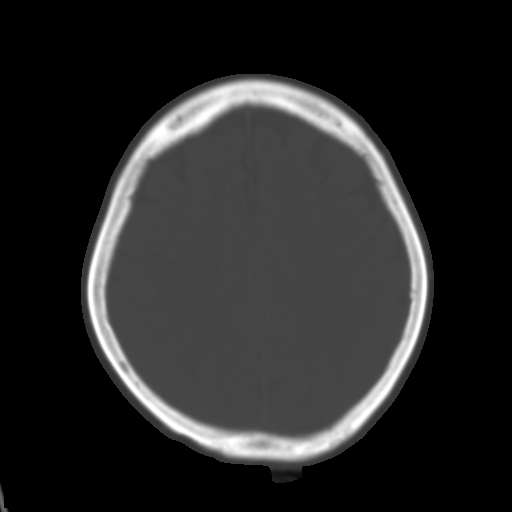
[im 22/29  brain]
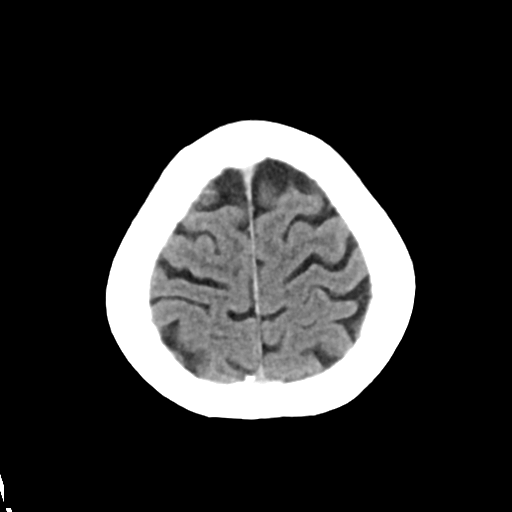
[im 25/29  brain]
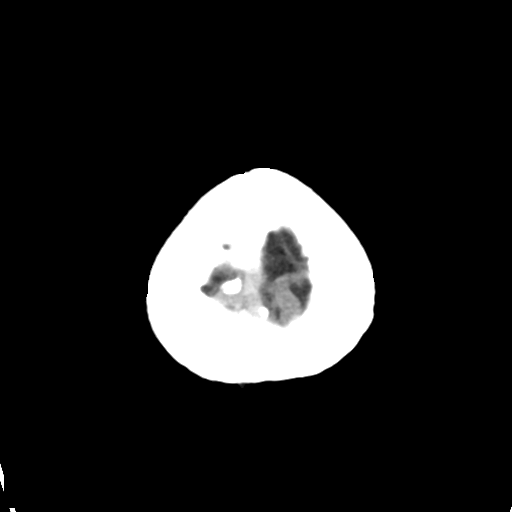

[Series 4: cor soft · coronal · 0.30mm/px · 3 of 64 slices shown]
[im 22/64  brain]
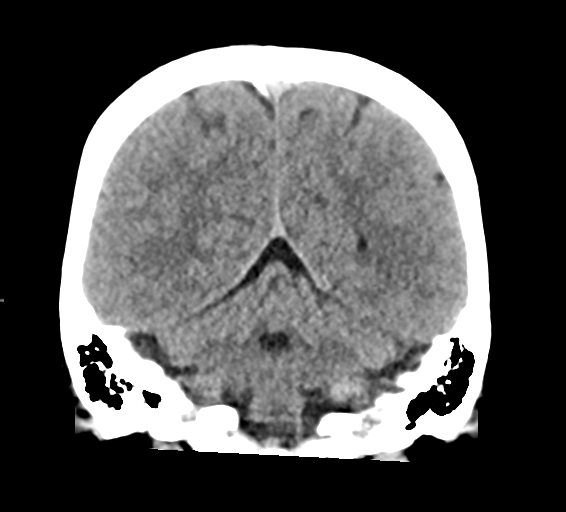
[im 29/64  brain]
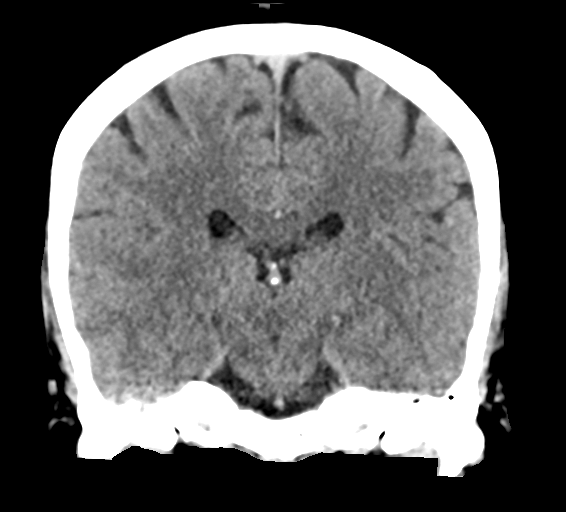
[im 36/64  brain]
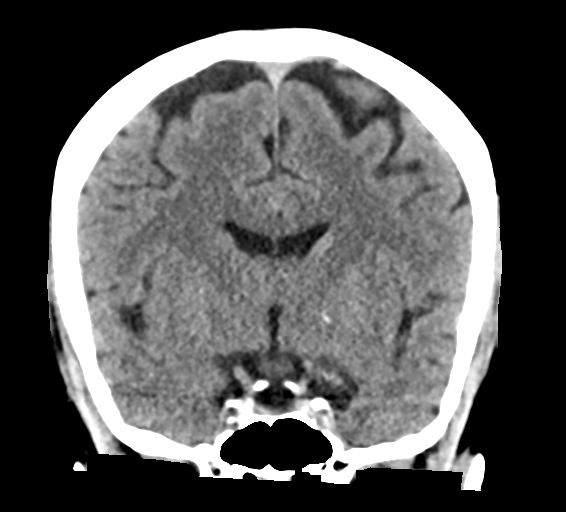

[Series 5: sag soft · sagittal · 0.30mm/px · 3 of 57 slices shown]
[im 19/57  brain]
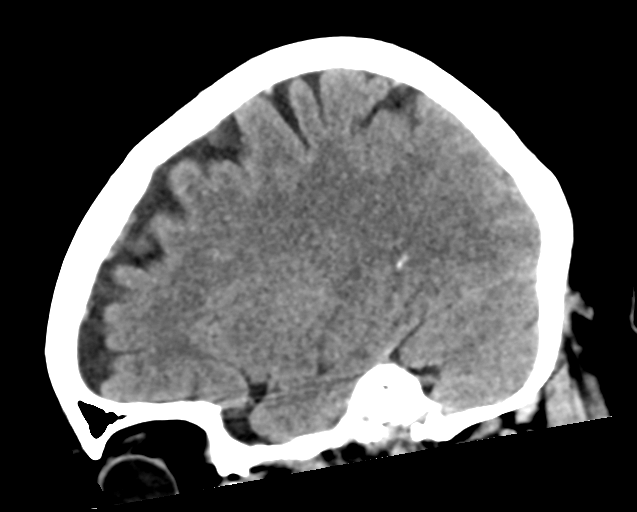
[im 29/57  brain]
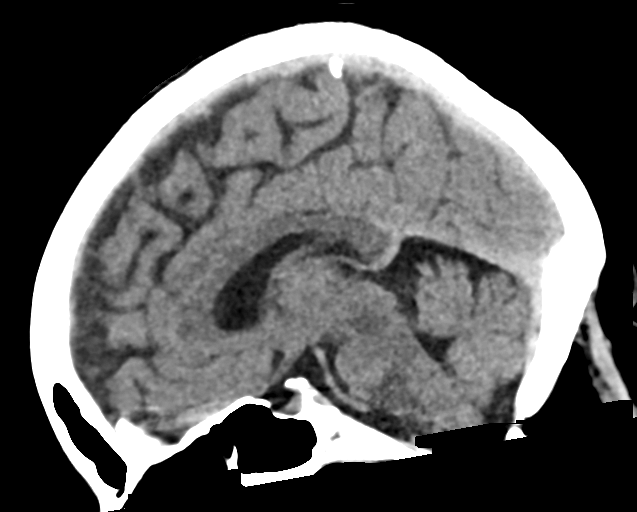
[im 38/57  brain]
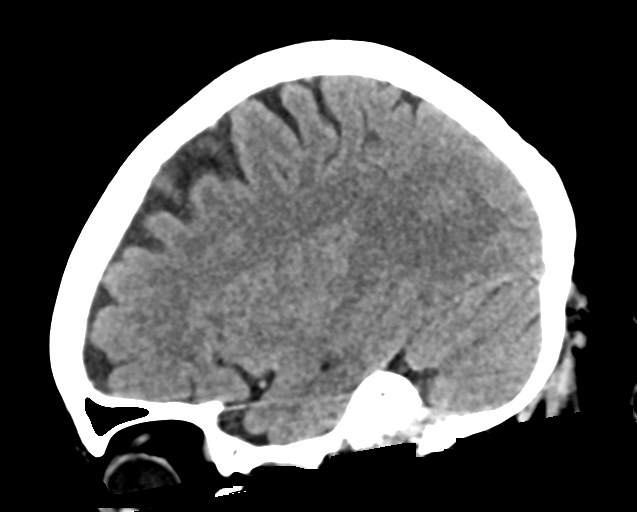

[16 of 47 positions shown; findings below may reference images not displayed]

FINDINGS: Brain: The ventricles and sulci are appropriate size for the
patient's age. The gray-white matter discrimination is preserved.
There is no acute intracranial hemorrhage. No mass effect or midline
shift. No extra-axial fluid collection.

Vascular: No hyperdense vessel or unexpected calcification.

Skull: Normal. Negative for fracture or focal lesion.

Sinuses/Orbits: No acute finding.

Other: None
IMPRESSION: No acute intracranial pathology.

## 2022-11-24 ENCOUNTER — Encounter: Payer: Self-pay | Admitting: Gastroenterology

## 2022-11-26 NOTE — H&P (Signed)
Pre-Procedure H&P   Patient ID: Jamie Fisher is a 67 y.o. female.  Gastroenterology Provider: Jaynie Collins, DO  Referring Provider: Jacob Moores, PA PCP: [facility]  Date: 11/26/2022  HPI Jamie Fisher is a 67 y.o. female who presents today for Esophagogastroduodenoscopy and Colonoscopy for Barrett's esophagus surveillance; colorectal cancer screening .  Information is garnered via chart review and medical Spanish interpreter.  Patient underwent EGD in March 2018 with grade B esophagitis.  Intestinal metaplasia was noted along with reflux esophagitis raising concern for Barrett's esophagus.  She was also noted to have gastritis at that time.  She has a history of H. pylori but H. pylori biopsies were negative on biopsy.  She underwent colonoscopy in August 2017 with left-sided diverticulosis.  Unfortunately stool obscured much of the right colon.  The exam was otherwise normal per report  Patient has chronic right upper quadrant pain however workup has been negative.  This includes imaging-CT, ultrasound and HIDA scan.  She persistently takes ibuprofen.  She notes a throat fullness and globus sensation.  Retrosternal burning and regurgitation with a history of reflux but has not been on PPI prior to being seen in the office.  She denies any dysphagia or odynophagia.  No family history of GI disease or malignancy.  Hemoglobin 14.4 MCV 86 platelets 362,000 creatinine 0.8 total bili 0.6 AST 69 ALT 53 alk phos 153   Past Medical History:  Diagnosis Date   Allergic rhinitis    Asthma    Carpal tunnel syndrome    Fibroid    GERD (gastroesophageal reflux disease)    Seizures (HCC)    AS A CHILD   Stress incontinence in female     Past Surgical History:  Procedure Laterality Date   COLONOSCOPY WITH PROPOFOL N/A 02/11/2016   Procedure: COLONOSCOPY WITH PROPOFOL;  Surgeon: Christena Deem, MD;  Location: [facility] ENDOSCOPY;  Service: Endoscopy;  Laterality:  N/A;   ESOPHAGOGASTRODUODENOSCOPY N/A 08/22/2016   Procedure: ESOPHAGOGASTRODUODENOSCOPY (EGD);  Surgeon: Christena Deem, MD;  Location: [facility] ENDOSCOPY;  Service: Endoscopy;  Laterality: N/A;   REMOVE NODULE Right 2017   BUTTOCK   TUBAL LIGATION      Family History No h/o GI disease or malignancy  Review of Systems  Constitutional:  Negative for activity change, appetite change, chills, diaphoresis, fatigue, fever and unexpected weight change.  HENT:  Negative for trouble swallowing and voice change.   Respiratory:  Negative for shortness of breath and wheezing.   Cardiovascular:  Negative for chest pain, palpitations and leg swelling.  Gastrointestinal:  Positive for abdominal pain. Negative for abdominal distention, anal bleeding, blood in stool, constipation, diarrhea, nausea, rectal pain and vomiting.  Musculoskeletal:  Negative for arthralgias and myalgias.  Skin:  Negative for color change and pallor.  Neurological:  Negative for dizziness, syncope and weakness.  Psychiatric/Behavioral:  Negative for confusion.   All other systems reviewed and are negative.    Medications No current facility-administered medications on file prior to encounter.   Current Outpatient Medications on File Prior to Encounter  Medication Sig Dispense Refill   dicyclomine (BENTYL) 10 MG capsule Take 1 capsule (10 mg total) by mouth 3 (three) times daily as needed (abdominal cramping). 12 capsule 0   etodolac (LODINE) 500 MG tablet Take 500 mg by mouth 2 (two) times daily.     ibuprofen (ADVIL) 600 MG tablet Take 1 tablet (600 mg total) by mouth every 6 (six) hours as needed. 30 tablet  0   ondansetron (ZOFRAN ODT) 4 MG disintegrating tablet Take 1 tablet (4 mg total) by mouth every 8 (eight) hours as needed for nausea or vomiting. 20 tablet 0   oxyCODONE-acetaminophen (PERCOCET) 5-325 MG tablet Take 1 tablet by mouth every 8 (eight) hours as needed. 20 tablet 0   pantoprazole (PROTONIX) 40 MG tablet  Take 40 mg by mouth daily.     polyethylene glycol (MIRALAX) 17 g packet Take 17 g by mouth daily. 14 each 0   tiZANidine (ZANAFLEX) 2 MG tablet 1-2 tablets at bedtime as needed for muscle spasm      Pertinent medications related to GI and procedure were reviewed by me with the patient prior to the procedure  No current facility-administered medications for this encounter.  Current Outpatient Medications:    dicyclomine (BENTYL) 10 MG capsule, Take 1 capsule (10 mg total) by mouth 3 (three) times daily as needed (abdominal cramping)., Disp: 12 capsule, Rfl: 0   etodolac (LODINE) 500 MG tablet, Take 500 mg by mouth 2 (two) times daily., Disp: , Rfl:    ibuprofen (ADVIL) 600 MG tablet, Take 1 tablet (600 mg total) by mouth every 6 (six) hours as needed., Disp: 30 tablet, Rfl: 0   ondansetron (ZOFRAN ODT) 4 MG disintegrating tablet, Take 1 tablet (4 mg total) by mouth every 8 (eight) hours as needed for nausea or vomiting., Disp: 20 tablet, Rfl: 0   oxyCODONE-acetaminophen (PERCOCET) 5-325 MG tablet, Take 1 tablet by mouth every 8 (eight) hours as needed., Disp: 20 tablet, Rfl: 0   pantoprazole (PROTONIX) 40 MG tablet, Take 40 mg by mouth daily., Disp: , Rfl:    polyethylene glycol (MIRALAX) 17 g packet, Take 17 g by mouth daily., Disp: 14 each, Rfl: 0   tiZANidine (ZANAFLEX) 2 MG tablet, 1-2 tablets at bedtime as needed for muscle spasm, Disp: , Rfl:       No Known Allergies Allergies were reviewed by me prior to the procedure  Objective   There is no height or weight on file to calculate BMI. There were no vitals filed for this visit.  *** Physical Exam Vitals and nursing note reviewed.  Constitutional:      General: She is not in acute distress.    Appearance: Normal appearance. She is not ill-appearing, toxic-appearing or diaphoretic.  HENT:     Head: Normocephalic and atraumatic.     Nose: Nose normal.     Mouth/Throat:     Mouth: Mucous membranes are moist.     Pharynx:  Oropharynx is clear.  Eyes:     General: No scleral icterus.    Extraocular Movements: Extraocular movements intact.  Cardiovascular:     Rate and Rhythm: Normal rate and regular rhythm.     Heart sounds: Normal heart sounds. No murmur heard.    No friction rub. No gallop.  Pulmonary:     Effort: Pulmonary effort is normal. No respiratory distress.     Breath sounds: Normal breath sounds. No wheezing, rhonchi or rales.  Abdominal:     General: Bowel sounds are normal. There is no distension.     Palpations: Abdomen is soft.     Tenderness: There is no abdominal tenderness. There is no guarding or rebound.  Musculoskeletal:     Cervical back: Neck supple.     Right lower leg: No edema.     Left lower leg: No edema.  Skin:    General: Skin is warm and dry.  Coloration: Skin is not jaundiced or pale.  Neurological:     General: No focal deficit present.     Mental Status: She is alert and oriented to person, place, and time. Mental status is at baseline.  Psychiatric:        Mood and Affect: Mood normal.        Behavior: Behavior normal.        Thought Content: Thought content normal.        Judgment: Judgment normal.      Assessment:  Jamie Fisher is a 67 y.o. female  who presents today for Esophagogastroduodenoscopy and Colonoscopy for Barrett's esophagus surveillance; colorectal cancer screening .  Plan:  Esophagogastroduodenoscopy and Colonoscopy with possible intervention today  Esophagogastroduodenoscopy and Colonoscopy with possible biopsy, control of bleeding, polypectomy, and interventions as necessary has been discussed with the patient/patient representative. Informed consent was obtained from the patient/patient representative after explaining the indication, nature, and risks of the procedure including but not limited to death, bleeding, perforation, missed neoplasm/lesions, cardiorespiratory compromise, and reaction to medications. Opportunity for questions  was given and appropriate answers were provided. Patient/patient representative has verbalized understanding is amenable to undergoing the procedure.   Jaynie Collins, DO  [facility]  Portions of the record may have been created with voice recognition software. Occasional wrong-word or 'sound-a-like' substitutions may have occurred due to the inherent limitations of voice recognition software.  Read the chart carefully and recognize, using context, where substitutions may have occurred.

## 2022-11-27 ENCOUNTER — Ambulatory Visit: Payer: Medicare HMO | Admitting: Certified Registered"

## 2022-11-27 ENCOUNTER — Encounter
Admission: RE | Disposition: A | Discharge status: Home / Self Care | Payer: Self-pay | Source: Home / Self Care | Attending: Gastroenterology

## 2022-11-27 ENCOUNTER — Encounter: Payer: Self-pay | Admitting: Gastroenterology

## 2022-11-27 ENCOUNTER — Ambulatory Visit
Admission: RE | Admit: 2022-11-27 | Discharge: 2022-11-27 | Disposition: A | Discharge status: Home / Self Care | Payer: Medicare HMO | Attending: Gastroenterology | Admitting: Gastroenterology

## 2022-11-27 DIAGNOSIS — K640 First degree hemorrhoids: Secondary | ICD-10-CM | POA: Diagnosis not present

## 2022-11-27 DIAGNOSIS — Z09 Encounter for follow-up examination after completed treatment for conditions other than malignant neoplasm: Secondary | ICD-10-CM | POA: Diagnosis not present

## 2022-11-27 DIAGNOSIS — D124 Benign neoplasm of descending colon: Secondary | ICD-10-CM | POA: Diagnosis not present

## 2022-11-27 DIAGNOSIS — K5730 Diverticulosis of large intestine without perforation or abscess without bleeding: Secondary | ICD-10-CM | POA: Diagnosis not present

## 2022-11-27 DIAGNOSIS — D122 Benign neoplasm of ascending colon: Secondary | ICD-10-CM | POA: Insufficient documentation

## 2022-11-27 DIAGNOSIS — Z8619 Personal history of other infectious and parasitic diseases: Secondary | ICD-10-CM | POA: Insufficient documentation

## 2022-11-27 DIAGNOSIS — D123 Benign neoplasm of transverse colon: Secondary | ICD-10-CM | POA: Insufficient documentation

## 2022-11-27 DIAGNOSIS — K224 Dyskinesia of esophagus: Secondary | ICD-10-CM | POA: Insufficient documentation

## 2022-11-27 DIAGNOSIS — K2270 Barrett's esophagus without dysplasia: Secondary | ICD-10-CM | POA: Insufficient documentation

## 2022-11-27 DIAGNOSIS — Z1211 Encounter for screening for malignant neoplasm of colon: Secondary | ICD-10-CM | POA: Insufficient documentation

## 2022-11-27 DIAGNOSIS — K635 Polyp of colon: Secondary | ICD-10-CM | POA: Diagnosis not present

## 2022-11-27 HISTORY — PX: ESOPHAGOGASTRODUODENOSCOPY (EGD) WITH PROPOFOL: SHX5813

## 2022-11-27 HISTORY — PX: COLONOSCOPY WITH PROPOFOL: SHX5780

## 2022-11-27 SURGERY — COLONOSCOPY WITH PROPOFOL
Anesthesia: General

## 2022-11-27 MED ORDER — SODIUM CHLORIDE 0.9 % IV SOLN
INTRAVENOUS | Status: DC
  Administered 2022-11-27: 20 mL/h via INTRAVENOUS

## 2022-11-27 MED ORDER — GLYCOPYRROLATE 0.2 MG/ML IJ SOLN
INTRAMUSCULAR | Status: DC | PRN
  Administered 2022-11-27: .2 mg via INTRAVENOUS

## 2022-11-27 MED ORDER — LIDOCAINE HCL (CARDIAC) PF 100 MG/5ML IV SOSY
PREFILLED_SYRINGE | INTRAVENOUS | Status: DC | PRN
  Administered 2022-11-27: 100 mg via INTRAVENOUS

## 2022-11-27 MED ORDER — PROPOFOL 1000 MG/100ML IV EMUL
INTRAVENOUS | Status: AC
  Filled 2022-11-27: qty 100

## 2022-11-27 MED ORDER — PROPOFOL 10 MG/ML IV BOLUS
INTRAVENOUS | Status: DC | PRN
  Administered 2022-11-27: 80 mg via INTRAVENOUS

## 2022-11-27 MED ORDER — DEXMEDETOMIDINE HCL IN NACL 80 MCG/20ML IV SOLN
INTRAVENOUS | Status: DC | PRN
  Administered 2022-11-27: 12 ug via INTRAVENOUS

## 2022-11-27 MED ORDER — PROPOFOL 500 MG/50ML IV EMUL
INTRAVENOUS | Status: DC | PRN
  Administered 2022-11-27: 150 ug/kg/min via INTRAVENOUS

## 2022-11-27 NOTE — Interval H&P Note (Signed)
History and Physical Interval Note: Preprocedure H&P from 11/27/22  was reviewed and there was no interval change after seeing and examining the patient.  Written consent was obtained from the patient after discussion of risks, benefits, and alternatives. Patient has consented to proceed with Esophagogastroduodenoscopy and Colonoscopy with possible intervention   11/27/2022 10:03 AM  Jamie Fisher  has presented today for surgery, with the diagnosis of 530.85 (ICD-9-CM) - K22.70 (ICD-10-CM) - Barrett's esophagus without dysplasia V76.51 (ICD-9-CM) - Z12.11 (ICD-10-CM) - Colon cancer screening.  The various methods of treatment have been discussed with the patient and family. After consideration of risks, benefits and other options for treatment, the patient has consented to  Procedure(s) with comments: COLONOSCOPY WITH PROPOFOL (N/A) - SPANISH INTERPRETER ESOPHAGOGASTRODUODENOSCOPY (EGD) WITH PROPOFOL (N/A) as a surgical intervention.  The patient's history has been reviewed, patient examined, no change in status, stable for surgery.  I have reviewed the patient's chart and labs.  Questions were answered to the patient's satisfaction.     Jaynie Collins

## 2022-11-27 NOTE — Anesthesia Preprocedure Evaluation (Addendum)
Anesthesia Evaluation  Patient identified by MRN, date of birth, ID band Patient awake    Reviewed: Allergy & Precautions, NPO status , Patient's Chart, lab work & pertinent test results  History of Anesthesia Complications Negative for: history of anesthetic complications  Airway Mallampati: II  TM Distance: >3 FB Neck ROM: full    Dental no notable dental hx.    Pulmonary asthma    Pulmonary exam normal        Cardiovascular negative cardio ROS Normal cardiovascular exam     Neuro/Psych neg Seizures (hx of seizures as a child, no medications)  Neuromuscular disease  negative psych ROS   GI/Hepatic Neg liver ROS,GERD  Controlled,,  Endo/Other  negative endocrine ROS    Renal/GU negative Renal ROS  negative genitourinary   Musculoskeletal   Abdominal   Peds  Hematology negative hematology ROS (+)   Anesthesia Other Findings Past Medical History: No date: Allergic rhinitis No date: Asthma No date: Carpal tunnel syndrome No date: Fibroid No date: GERD (gastroesophageal reflux disease) No date: Seizures (HCC)     Comment:  AS A CHILD No date: Stress incontinence in female  Past Surgical History: 02/11/2016: COLONOSCOPY WITH PROPOFOL; N/A     Comment:  Procedure: COLONOSCOPY WITH PROPOFOL;  Surgeon: Christena Deem, MD;  Location: [facility] ENDOSCOPY;  Service:               Endoscopy;  Laterality: N/A; 08/22/2016: ESOPHAGOGASTRODUODENOSCOPY; N/A     Comment:  Procedure: ESOPHAGOGASTRODUODENOSCOPY (EGD);  Surgeon:               Christena Deem, MD;  Location: [facility] ENDOSCOPY;                Service: Endoscopy;  Laterality: N/A; 2017: REMOVE NODULE; Right     Comment:  BUTTOCK No date: TUBAL LIGATION     Reproductive/Obstetrics negative OB ROS                              Anesthesia Physical Anesthesia Plan  ASA: 2  Anesthesia Plan: General   Post-op Pain  Management: Minimal or no pain anticipated   Induction: Intravenous  PONV Risk Score and Plan: Propofol infusion and TIVA  Airway Management Planned: Natural Airway and Nasal Cannula  Additional Equipment:   Intra-op Plan:   Post-operative Plan:   Informed Consent: I have reviewed the patients History and Physical, chart, labs and discussed the procedure including the risks, benefits and alternatives for the proposed anesthesia with the patient or authorized representative who has indicated his/her understanding and acceptance.     Dental Advisory Given  Plan Discussed with: Anesthesiologist, CRNA and Surgeon  Anesthesia Plan Comments: (Patient consented for risks of anesthesia including but not limited to:  - adverse reactions to medications - risk of airway placement if required - damage to eyes, teeth, lips or other oral mucosa - nerve damage due to positioning  - sore throat or hoarseness - Damage to heart, brain, nerves, lungs, other parts of body or loss of life  Patient voiced understanding.)         Anesthesia Quick Evaluation

## 2022-11-27 NOTE — Anesthesia Procedure Notes (Signed)
Procedure Name: MAC Date/Time: 11/27/2022 10:11 AM  Performed by: Cheral Bay, CRNAPre-anesthesia Checklist: Patient identified, Emergency Drugs available, Suction available, Patient being monitored and Timeout performed Patient Re-evaluated:Patient Re-evaluated prior to induction Oxygen Delivery Method: Nasal cannula Induction Type: IV induction Placement Confirmation: positive ETCO2 and CO2 detector

## 2022-11-27 NOTE — OR Nursing (Signed)
[employer] Engineer, manufacturing used in Pre-op [phone]).

## 2022-11-27 NOTE — Op Note (Addendum)
[facility] Gastroenterology Patient Name: Jamie Fisher Procedure Date: 11/27/2022 10:01 AM MRN: [id number] Account #: [id number] Date of Birth: 09-01-1955 Admit Type: Outpatient Age: 67 Room: [facility] ENDO ROOM 2 Gender: Female Note Status: Finalized Instrument Name: Upper Endoscope [id number] Procedure:             Upper GI endoscopy Indications:           Surveillance for malignancy due to personal history of                         Barrett's esophagus, Follow-up of Barrett's esophagus Providers:             Trenda Moots, DO Referring MD:          Jaynie Collins DO, DO (Referring MD) Medicines:             Monitored Anesthesia Care Complications:         No immediate complications. Estimated blood loss:                         Minimal. Procedure:             Pre-Anesthesia Assessment:                        - Prior to the procedure, a History and Physical was                         performed, and patient medications and allergies were                         reviewed. The patient is competent. The risks and                         benefits of the procedure and the sedation options and                         risks were discussed with the patient. All questions                         were answered and informed consent was obtained.                         Patient identification and proposed procedure were                         verified by the physician, the nurse, the anesthetist                         and the technician in the endoscopy suite. Mental                         Status Examination: alert and oriented. Airway                         Examination: normal oropharyngeal airway and neck                         mobility. Respiratory Examination: clear to  auscultation. CV Examination: RRR, no murmurs, no S3                         or S4. Prophylactic Antibiotics: The patient does not                         require  prophylactic antibiotics. Prior                         Anticoagulants: The patient has taken no anticoagulant                         or antiplatelet agents. ASA Grade Assessment: II - A                         patient with mild systemic disease. After reviewing                         the risks and benefits, the patient was deemed in                         satisfactory condition to undergo the procedure. The                         anesthesia plan was to use monitored anesthesia care                         (MAC). Immediately prior to administration of                         medications, the patient was re-assessed for adequacy                         to receive sedatives. The heart rate, respiratory                         rate, oxygen saturations, blood pressure, adequacy of                         pulmonary ventilation, and response to care were                         monitored throughout the procedure. The physical                         status of the patient was re-assessed after the                         procedure.                        After obtaining informed consent, the endoscope was                         passed under direct vision. Throughout the procedure,                         the patient's blood pressure, pulse, and oxygen  saturations were monitored continuously. The Endoscope                         was introduced through the mouth, and advanced to the                         second part of duodenum. The upper GI endoscopy was                         accomplished without difficulty. The patient tolerated                         the procedure well. Findings:      The duodenal bulb, first portion of the duodenum and second portion of       the duodenum were normal. Estimated blood loss: none.      The entire examined stomach was normal. Estimated blood loss: none.      The Z-line was regular. Estimated blood loss: none. Biopsies were taken        with a cold forceps for histology. Estimated blood loss was minimal.      Abnormal motility was noted in the esophagus. The cricopharyngeus was       normal. There is spasticity of the esophageal body. The distal       esophagus/lower esophageal sphincter is open. Estimated blood loss: none.      The exam of the esophagus was otherwise normal. Impression:            - Normal duodenal bulb, first portion of the duodenum                         and second portion of the duodenum.                        - Normal stomach.                        - Z-line regular. Biopsied.                        - Abnormal esophageal motility, suspicious for                         esophageal spasm.                        - Bulbo duodenal normal, primera porci?n del duodeno y                         segunda porci?n del duodeno.                        - Est?mago normal.                        - L?nea Z regular. Biopsia.                        - Motilidad esof?gica anormal, sospechosa de espasmo                         esof?gico. Recommendation:        -  Patient has a contact number available for                         emergencies. The signs and symptoms of potential                         delayed complications were discussed with the patient.                         Return to normal activities tomorrow. Written                         discharge instructions were provided to the patient.                        - Discharge patient to home.                        - Resume previous diet.                        - Continue present medications.                        - Continue protonix/pantoprazole                        - Await pathology results.                        - Repeat upper endoscopy for surveillance based on                         pathology results.                        - Return to referring physician as previously                         scheduled.                        - The findings and  recommendations were discussed with                         the patient. Procedure Code(s):     --- Professional ---                        [id number], Esophagogastroduodenoscopy, flexible,                         transoral; with biopsy, single or multiple Diagnosis Code(s):     --- Professional ---                        K22.70, Barrett's esophagus without dysplasia                        K22.4, Dyskinesia of esophagus CPT copyright 2022 American Medical Association. All rights reserved. The codes documented in this report are preliminary and upon coder review may  be revised to meet current compliance requirements. Attending Participation:  I personally performed the entire procedure. Elfredia Nevins, DO Jaynie Collins DO, DO 11/27/2022 10:24:36 AM This report has been signed electronically. Number of Addenda: 0 Note Initiated On: 11/27/2022 10:01 AM Estimated Blood Loss:  Estimated blood loss was minimal.      [facility]

## 2022-11-27 NOTE — Anesthesia Postprocedure Evaluation (Signed)
Anesthesia Post Note  Patient: Jamie Fisher  Procedure(s) Performed: COLONOSCOPY WITH PROPOFOL ESOPHAGOGASTRODUODENOSCOPY (EGD) WITH PROPOFOL  Patient location during evaluation: Endoscopy Anesthesia Type: General Level of consciousness: awake and alert Pain management: pain level controlled Vital Signs Assessment: post-procedure vital signs reviewed and stable Respiratory status: spontaneous breathing, nonlabored ventilation, respiratory function stable and patient connected to nasal cannula oxygen Cardiovascular status: blood pressure returned to baseline and stable Postop Assessment: no apparent nausea or vomiting Anesthetic complications: no   No notable events documented.   Last Vitals:  Vitals:   11/27/22 1050 11/27/22 1100  BP: (!) 94/56 98/63  Pulse: 76 62  Resp: 13 14  Temp: (!) 35.9 C   SpO2: 95% 98%    Last Pain:  Vitals:   11/27/22 1100  TempSrc:   PainSc: 0-No pain                 Louie Boston

## 2022-11-27 NOTE — Op Note (Signed)
[facility] Gastroenterology Patient Name: Jamie Fisher Procedure Date: 11/27/2022 10:01 AM MRN: [id number] Account #: [id number] Date of Birth: 1955/11/24 Admit Type: Outpatient Age: 67 Room: [facility] ENDO ROOM 2 Gender: Female Note Status: Finalized Instrument Name: Colonscope [id number] Procedure:             Colonoscopy Indications:           Screening for colorectal malignant neoplasm Providers:             Trenda Moots, DO Referring MD:          Jaynie Collins DO, DO (Referring MD) Medicines:             Monitored Anesthesia Care Complications:         No immediate complications. Estimated blood loss:                         Minimal. Procedure:             Pre-Anesthesia Assessment:                        - Prior to the procedure, a History and Physical was                         performed, and patient medications and allergies were                         reviewed. The patient is competent. The risks and                         benefits of the procedure and the sedation options and                         risks were discussed with the patient. All questions                         were answered and informed consent was obtained.                         Patient identification and proposed procedure were                         verified by the physician, the nurse, the anesthetist                         and the technician in the endoscopy suite. Mental                         Status Examination: alert and oriented. Airway                         Examination: normal oropharyngeal airway and neck                         mobility. Respiratory Examination: clear to                         auscultation. CV Examination: RRR, no murmurs, no S3  or S4. Prophylactic Antibiotics: The patient does not                         require prophylactic antibiotics. Prior                         Anticoagulants: The patient has taken no  anticoagulant                         or antiplatelet agents. ASA Grade Assessment: II - A                         patient with mild systemic disease. After reviewing                         the risks and benefits, the patient was deemed in                         satisfactory condition to undergo the procedure. The                         anesthesia plan was to use monitored anesthesia care                         (MAC). Immediately prior to administration of                         medications, the patient was re-assessed for adequacy                         to receive sedatives. The heart rate, respiratory                         rate, oxygen saturations, blood pressure, adequacy of                         pulmonary ventilation, and response to care were                         monitored throughout the procedure. The physical                         status of the patient was re-assessed after the                         procedure.                        After obtaining informed consent, the colonoscope was                         passed under direct vision. Throughout the procedure,                         the patient's blood pressure, pulse, and oxygen                         saturations were monitored continuously. The  Colonoscope was introduced through the anus and                         advanced to the the terminal ileum, with                         identification of the appendiceal orifice and IC                         valve. The colonoscopy was performed without                         difficulty. The patient tolerated the procedure well.                         The quality of the bowel preparation was evaluated                         using the BBPS [facility] Bowel Preparation Scale) with                         scores of: Right Colon = 2 (minor amount of residual                         staining, small fragments of stool and/or opaque                          liquid, but mucosa seen well), Transverse Colon = 3                         (entire mucosa seen well with no residual staining,                         small fragments of stool or opaque liquid) and Left                         Colon = 3 (entire mucosa seen well with no residual                         staining, small fragments of stool or opaque liquid).                         The total BBPS score equals 8. The quality of the                         bowel preparation was excellent. The terminal ileum,                         ileocecal valve, appendiceal orifice, and rectum were                         photographed. Findings:      The perianal and digital rectal examinations were normal. Pertinent       negatives include normal sphincter tone.      The terminal ileum appeared normal. Estimated blood loss: none.      Multiple small-mouthed diverticula were found in the sigmoid colon.  Estimated blood loss: none.      Four sessile polyps were found in the descending colon (1), transverse       colon (2) and ascending colon (1). The polyps were 1 to 2 mm in size.       These polyps were removed with a jumbo cold forceps. Resection and       retrieval were complete. Estimated blood loss was minimal.      Non-bleeding internal hemorrhoids were found during retroflexion. The       hemorrhoids were Grade I (internal hemorrhoids that do not prolapse).       Estimated blood loss: none.      The exam was otherwise without abnormality on direct and retroflexion       views. Impression:            - The examined portion of the ileum was normal.                        - Diverticulosis in the sigmoid colon.                        - Four 1 to 2 mm polyps in the descending colon, in                         the transverse colon and in the ascending colon,                         removed with a jumbo cold forceps. Resected and                         retrieved.                        -  Non-bleeding internal hemorrhoids.                        - The examination was otherwise normal on direct and                         retroflexion views.                        -                        - La porcin examinada del leon era normal.                        - Diverticulosis en el colon sigmoide.                        - Cuatro plipos de 1 a 2 mm en el colon descendente,                         en el colon transverso y en el colon ascendente,                         extirpados con unas pinzas fras gigantes. Resecado y  recuperado.                        - Hemorroides internas no sangrantes.                        - Por lo dems, el examen fue normal en las vistas                         directa y en retroflexin. Recommendation:        - Patient has a contact number available for                         emergencies. The signs and symptoms of potential                         delayed complications were discussed with the patient.                         Return to normal activities tomorrow. Written                         discharge instructions were provided to the patient.                        - Discharge patient to home.                        - Resume previous diet.                        - Continue present medications.                        - Await pathology results.                        - Repeat colonoscopy for surveillance based on                         pathology results.                        - Return to referring physician as previously                         scheduled.                        - The findings and recommendations were discussed with                         the patient. Procedure Code(s):     --- Professional ---                        [phone], Colonoscopy, flexible; with biopsy, single or                         multiple Diagnosis Code(s):     --- Professional ---  Z12.11, Encounter for  screening for malignant neoplasm                         of colon                        K64.0, First degree hemorrhoids                        D12.4, Benign neoplasm of descending colon                        D12.3, Benign neoplasm of transverse colon (hepatic                         flexure or splenic flexure)                        D12.2, Benign neoplasm of ascending colon                        K57.30, Diverticulosis of large intestine without                         perforation or abscess without bleeding CPT copyright 2022 American [employer] Association. All rights reserved. The codes documented in this report are preliminary and upon coder review may  be revised to meet current compliance requirements. Attending Participation:      I personally performed the entire procedure. Elfredia Nevins, DO Jaynie Collins DO, DO 11/27/2022 10:56:47 AM This report has been signed electronically. Number of Addenda: 0 Note Initiated On: 11/27/2022 10:01 AM Scope Withdrawal Time: 0 hours 15 minutes 3 seconds  Total Procedure Duration: 0 hours 19 minutes 30 seconds  Estimated Blood Loss:  Estimated blood loss was minimal.      [facility]

## 2022-11-27 NOTE — Transfer of Care (Signed)
Immediate Anesthesia Transfer of Care Note  Patient: Jamie Fisher  Procedure(s) Performed: COLONOSCOPY WITH PROPOFOL ESOPHAGOGASTRODUODENOSCOPY (EGD) WITH PROPOFOL  Patient Location: PACU and Endoscopy Unit  Anesthesia Type:General  Level of Consciousness: sedated  Airway & Oxygen Therapy: Patient Spontanous Breathing  Post-op Assessment: Report given to RN and Post -op Vital signs reviewed and stable  Post vital signs: Reviewed and stable  Last Vitals:  Vitals Value Taken Time  BP    Temp    Pulse 73 11/27/22 1050  Resp 13 11/27/22 1050  SpO2 95 % 11/27/22 1050  Vitals shown include unvalidated device data.  Last Pain:  Vitals:   11/27/22 0948  TempSrc: Temporal  PainSc: 0-No pain         Complications: No notable events documented.

## 2022-11-28 ENCOUNTER — Encounter: Payer: Self-pay | Admitting: Gastroenterology

## 2023-12-18 DIAGNOSIS — K219 Gastro-esophageal reflux disease without esophagitis: Secondary | ICD-10-CM | POA: Diagnosis not present

## 2023-12-18 DIAGNOSIS — R7309 Other abnormal glucose: Secondary | ICD-10-CM | POA: Diagnosis not present

## 2023-12-18 DIAGNOSIS — R42 Dizziness and giddiness: Secondary | ICD-10-CM | POA: Diagnosis not present

## 2023-12-18 DIAGNOSIS — Z1389 Encounter for screening for other disorder: Secondary | ICD-10-CM | POA: Diagnosis not present

## 2023-12-18 DIAGNOSIS — Z0000 Encounter for general adult medical examination without abnormal findings: Secondary | ICD-10-CM | POA: Diagnosis not present

## 2023-12-18 DIAGNOSIS — M810 Age-related osteoporosis without current pathological fracture: Secondary | ICD-10-CM | POA: Diagnosis not present

## 2023-12-18 DIAGNOSIS — Z23 Encounter for immunization: Secondary | ICD-10-CM | POA: Diagnosis not present

## 2023-12-18 DIAGNOSIS — M5441 Lumbago with sciatica, right side: Secondary | ICD-10-CM | POA: Diagnosis not present

## 2023-12-19 ENCOUNTER — Other Ambulatory Visit: Payer: Self-pay | Admitting: Nurse Practitioner

## 2023-12-19 DIAGNOSIS — Z780 Asymptomatic menopausal state: Secondary | ICD-10-CM

## 2023-12-19 DIAGNOSIS — Z1231 Encounter for screening mammogram for malignant neoplasm of breast: Secondary | ICD-10-CM

## 2023-12-26 DIAGNOSIS — Z1211 Encounter for screening for malignant neoplasm of colon: Secondary | ICD-10-CM | POA: Diagnosis not present

## 2024-01-03 DIAGNOSIS — E669 Obesity, unspecified: Secondary | ICD-10-CM | POA: Diagnosis not present

## 2024-01-03 DIAGNOSIS — R7309 Other abnormal glucose: Secondary | ICD-10-CM | POA: Diagnosis not present

## 2024-01-03 DIAGNOSIS — K760 Fatty (change of) liver, not elsewhere classified: Secondary | ICD-10-CM | POA: Diagnosis not present

## 2024-01-03 DIAGNOSIS — R42 Dizziness and giddiness: Secondary | ICD-10-CM | POA: Diagnosis not present

## 2024-01-03 DIAGNOSIS — M810 Age-related osteoporosis without current pathological fracture: Secondary | ICD-10-CM | POA: Diagnosis not present

## 2024-01-14 DIAGNOSIS — E119 Type 2 diabetes mellitus without complications: Secondary | ICD-10-CM | POA: Diagnosis not present

## 2024-02-25 DIAGNOSIS — E039 Hypothyroidism, unspecified: Secondary | ICD-10-CM | POA: Diagnosis not present

## 2024-03-10 ENCOUNTER — Emergency Department (HOSPITAL_COMMUNITY)

## 2024-03-10 ENCOUNTER — Emergency Department (HOSPITAL_COMMUNITY)
Admission: EM | Admit: 2024-03-10 | Discharge: 2024-03-10 | Disposition: A | Discharge status: Home / Self Care | Attending: Emergency Medicine | Admitting: Emergency Medicine

## 2024-03-10 ENCOUNTER — Other Ambulatory Visit: Payer: Self-pay

## 2024-03-10 DIAGNOSIS — S0990XA Unspecified injury of head, initial encounter: Secondary | ICD-10-CM | POA: Diagnosis not present

## 2024-03-10 DIAGNOSIS — S3992XA Unspecified injury of lower back, initial encounter: Secondary | ICD-10-CM | POA: Diagnosis not present

## 2024-03-10 DIAGNOSIS — Z79899 Other long term (current) drug therapy: Secondary | ICD-10-CM | POA: Diagnosis not present

## 2024-03-10 DIAGNOSIS — S3991XA Unspecified injury of abdomen, initial encounter: Secondary | ICD-10-CM | POA: Diagnosis not present

## 2024-03-10 DIAGNOSIS — S299XXA Unspecified injury of thorax, initial encounter: Secondary | ICD-10-CM | POA: Diagnosis not present

## 2024-03-10 DIAGNOSIS — R1031 Right lower quadrant pain: Secondary | ICD-10-CM | POA: Insufficient documentation

## 2024-03-10 DIAGNOSIS — Z041 Encounter for examination and observation following transport accident: Secondary | ICD-10-CM | POA: Diagnosis not present

## 2024-03-10 DIAGNOSIS — R9389 Abnormal findings on diagnostic imaging of other specified body structures: Secondary | ICD-10-CM | POA: Diagnosis not present

## 2024-03-10 DIAGNOSIS — I10 Essential (primary) hypertension: Secondary | ICD-10-CM | POA: Diagnosis not present

## 2024-03-10 DIAGNOSIS — I6523 Occlusion and stenosis of bilateral carotid arteries: Secondary | ICD-10-CM | POA: Diagnosis not present

## 2024-03-10 DIAGNOSIS — Y92410 Unspecified street and highway as the place of occurrence of the external cause: Secondary | ICD-10-CM | POA: Insufficient documentation

## 2024-03-10 DIAGNOSIS — S20314A Abrasion of middle front wall of thorax, initial encounter: Secondary | ICD-10-CM | POA: Diagnosis not present

## 2024-03-10 DIAGNOSIS — R0789 Other chest pain: Secondary | ICD-10-CM | POA: Diagnosis not present

## 2024-03-10 DIAGNOSIS — S3993XA Unspecified injury of pelvis, initial encounter: Secondary | ICD-10-CM | POA: Diagnosis not present

## 2024-03-10 DIAGNOSIS — R109 Unspecified abdominal pain: Secondary | ICD-10-CM | POA: Diagnosis not present

## 2024-03-10 DIAGNOSIS — R079 Chest pain, unspecified: Secondary | ICD-10-CM | POA: Diagnosis not present

## 2024-03-10 DIAGNOSIS — S199XXA Unspecified injury of neck, initial encounter: Secondary | ICD-10-CM | POA: Diagnosis not present

## 2024-03-10 DIAGNOSIS — K5730 Diverticulosis of large intestine without perforation or abscess without bleeding: Secondary | ICD-10-CM | POA: Diagnosis not present

## 2024-03-10 DIAGNOSIS — M47812 Spondylosis without myelopathy or radiculopathy, cervical region: Secondary | ICD-10-CM | POA: Diagnosis not present

## 2024-03-10 DIAGNOSIS — D259 Leiomyoma of uterus, unspecified: Secondary | ICD-10-CM | POA: Diagnosis not present

## 2024-03-10 DIAGNOSIS — M4802 Spinal stenosis, cervical region: Secondary | ICD-10-CM | POA: Diagnosis not present

## 2024-03-10 LAB — CBC
HCT: 42.3 % (ref 36.0–46.0)
Hemoglobin: 14.5 g/dL (ref 12.0–15.0)
MCH: 30.1 pg (ref 26.0–34.0)
MCHC: 34.3 g/dL (ref 30.0–36.0)
MCV: 87.8 fL (ref 80.0–100.0)
Platelets: 336 K/uL (ref 150–400)
RBC: 4.82 MIL/uL (ref 3.87–5.11)
RDW: 13 % (ref 11.5–15.5)
WBC: 9.9 K/uL (ref 4.0–10.5)
nRBC: 0 % (ref 0.0–0.2)

## 2024-03-10 LAB — PROTIME-INR
INR: 0.9 (ref 0.8–1.2)
Prothrombin Time: 13 s (ref 11.4–15.2)

## 2024-03-10 LAB — COMPREHENSIVE METABOLIC PANEL WITH GFR
ALT: 80 U/L — ABNORMAL HIGH (ref 0–44)
AST: 107 U/L — ABNORMAL HIGH (ref 15–41)
Albumin: 3.6 g/dL (ref 3.5–5.0)
Alkaline Phosphatase: 114 U/L (ref 38–126)
Anion gap: 11 (ref 5–15)
BUN: 9 mg/dL (ref 8–23)
CO2: 19 mmol/L — ABNORMAL LOW (ref 22–32)
Calcium: 8.8 mg/dL — ABNORMAL LOW (ref 8.9–10.3)
Chloride: 103 mmol/L (ref 98–111)
Creatinine, Ser: 0.75 mg/dL (ref 0.44–1.00)
GFR, Estimated: >60 mL/min (ref >60)
Glucose, Bld: 253 mg/dL — ABNORMAL HIGH (ref 70–99)
Potassium: 3.2 mmol/L — ABNORMAL LOW (ref 3.5–5.1)
Sodium: 133 mmol/L — ABNORMAL LOW (ref 135–145)
Total Bilirubin: 0.6 mg/dL (ref 0.0–1.2)
Total Protein: 7.6 g/dL (ref 6.5–8.1)

## 2024-03-10 LAB — I-STAT CHEM 8, ED
BUN: 10 mg/dL (ref 8–23)
Calcium, Ion: 1.04 mmol/L — ABNORMAL LOW (ref 1.15–1.40)
Chloride: 106 mmol/L (ref 98–111)
Creatinine, Ser: 0.5 mg/dL (ref 0.44–1.00)
Glucose, Bld: 276 mg/dL — ABNORMAL HIGH (ref 70–99)
HCT: 43 % (ref 36.0–46.0)
Hemoglobin: 14.6 g/dL (ref 12.0–15.0)
Potassium: 4.2 mmol/L (ref 3.5–5.1)
Sodium: 137 mmol/L (ref 135–145)
TCO2: 20 mmol/L — ABNORMAL LOW (ref 22–32)

## 2024-03-10 LAB — I-STAT CG4 LACTIC ACID, ED: Lactic Acid, Venous: 3.6 mmol/L (ref 0.5–1.9)

## 2024-03-10 LAB — SAMPLE TO BLOOD BANK

## 2024-03-10 LAB — ETHANOL: Alcohol, Ethyl (B): <15 mg/dL (ref <15)

## 2024-03-10 MED ORDER — SODIUM CHLORIDE 0.9% FLUSH
3.0000 mL | Freq: Two times a day (BID) | INTRAVENOUS | Status: DC
  Administered 2024-03-10: 3 mL via INTRAVENOUS

## 2024-03-10 MED ORDER — SODIUM CHLORIDE 0.9% FLUSH: 3.0000 mL | INTRAVENOUS | Status: DC | PRN

## 2024-03-10 MED ORDER — IOHEXOL 350 MG/ML SOLN
75.0000 mL | Freq: Once | INTRAVENOUS | Status: AC | PRN
  Administered 2024-03-10: 75 mL via INTRAVENOUS

## 2024-03-10 MED ORDER — SODIUM CHLORIDE 0.9 % IV SOLN: 250.0000 mL | INTRAVENOUS | Status: DC | PRN

## 2024-03-10 MED ORDER — TETANUS-DIPHTH-ACELL PERTUSSIS 5-2.5-18.5 LF-MCG/0.5 IM SUSY
0.5000 mL | PREFILLED_SYRINGE | Freq: Once | INTRAMUSCULAR | Status: AC
  Administered 2024-03-10: 0.5 mL via INTRAMUSCULAR
  Filled 2024-03-10: qty 0.5

## 2024-03-10 MED ORDER — TETANUS-DIPHTH-ACELL PERTUSSIS 5-2.5-18.5 LF-MCG/0.5 IM SUSY
PREFILLED_SYRINGE | INTRAMUSCULAR | Status: AC
  Filled 2024-03-10: qty 0.5

## 2024-03-10 MED ORDER — FENTANYL CITRATE PF 50 MCG/ML IJ SOSY
50.0000 ug | PREFILLED_SYRINGE | Freq: Once | INTRAMUSCULAR | Status: AC
  Administered 2024-03-10: 50 ug via INTRAVENOUS
  Filled 2024-03-10: qty 1

## 2024-03-10 MED ORDER — ONDANSETRON HCL 4 MG/2ML IJ SOLN
4.0000 mg | Freq: Once | INTRAMUSCULAR | Status: AC
  Administered 2024-03-10: 4 mg via INTRAVENOUS
  Filled 2024-03-10: qty 2

## 2024-03-10 MED ORDER — KETOROLAC TROMETHAMINE 15 MG/ML IJ SOLN
15.0000 mg | Freq: Once | INTRAMUSCULAR | Status: AC
Start: 2024-03-10 — End: 2024-03-10
  Administered 2024-03-10: 15 mg via INTRAVENOUS
  Filled 2024-03-10: qty 1
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
## 2024-03-10 NOTE — ED Notes (Signed)
 Ambulated, no issues, independent stable gait

## 2024-03-10 NOTE — ED Notes (Signed)
 Patient transported to CT
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
## 2024-03-10 NOTE — ED Notes (Signed)
 Pt on the monitor and alert
